# Patient Record
Sex: Male | Born: 1975 | Race: Black or African American | Hispanic: No | Marital: Married | Smoking: Never smoker
Health system: Southern US, Community
[De-identification: ages and names within clinical notes are randomized; demographics above are authoritative.]

## PROBLEM LIST (undated history)

## (undated) DIAGNOSIS — Z86718 Personal history of other venous thrombosis and embolism: Secondary | ICD-10-CM

## (undated) DIAGNOSIS — D759 Disease of blood and blood-forming organs, unspecified: Secondary | ICD-10-CM

## (undated) DIAGNOSIS — T7840XA Allergy, unspecified, initial encounter: Secondary | ICD-10-CM

## (undated) HISTORY — DX: Allergy, unspecified, initial encounter: T78.40XA

## (undated) HISTORY — PX: KNEE SURGERY: SHX244

---

## 2007-07-15 ENCOUNTER — Emergency Department (HOSPITAL_COMMUNITY): Admission: EM | Admit: 2007-07-15 | Discharge: 2007-07-15 | Payer: Self-pay | Admitting: Emergency Medicine

## 2007-07-17 ENCOUNTER — Ambulatory Visit (HOSPITAL_COMMUNITY): Admission: RE | Admit: 2007-07-17 | Discharge: 2007-07-18 | Payer: Self-pay | Admitting: Specialist

## 2007-07-29 ENCOUNTER — Inpatient Hospital Stay (HOSPITAL_COMMUNITY): Admission: RE | Admit: 2007-07-29 | Discharge: 2007-08-04 | Payer: Self-pay | Admitting: Orthopedic Surgery

## 2011-03-01 NOTE — Discharge Summary (Signed)
[name], [name]                ACCOUNT NO.:  [id number]   MEDICAL RECORD NO.:  [id number]          PATIENT TYPE:  INP   LOCATION:  4728                         FACILITY:  [facility]   PHYSICIAN:  Brian Hayden, M.D. DATE OF BIRTH:  [birth year]   DATE OF ADMISSION:  07/29/2007  DATE OF DISCHARGE:  08/04/2007                               DISCHARGE SUMMARY   PRIMARY CARE PHYSICIAN:  Dr. Dorothyann Hayden.   FINAL DIAGNOSES:  1. Bilateral pulmonary emboli.  2. Respiratory distress.  3. Anemia.  4. Severe iron deficiency.  5. Weakness.   PROCEDURES:  1. Chest x-ray, portable, completed on July 29, 2007, August 01, 2007 and August 03, 2007.  2. A CT scan with angiographic contrast completed July 29, 2007.   HISTORY OF PRESENT ILLNESS:  Brian Hayden is a 35 year old gentleman who  recently underwent left patellar tendon repair secondary to rupture on  July 17, 2007.  He indicated that the night prior to this  admission, he had developed shortness of breath.  He did not complain of  any chest pain or cough.  He went to see Dr. August Hayden, his orthopedic  surgeon, who ordered a CAT scan of the patient's chest.  The CAT scan  revealed bilateral pulmonary emboli, therefore, he was admitted into the  hospital .   PAST MEDICAL HISTORY:  Please see that dictated by Dr. Maurice Hayden.   HOSPITAL COURSE:  1. Bilateral pulmonary emboli.  A chest x-ray was done on July 29, 2007.  It revealed bibasilar air space disease, which could be due      to pulmonary infarct or pneumonia.  A CT scan of the patient's      chest with angiographic contrast was completed.  It revealed      bilateral lower lobe pulmonary thromboembolism, bilateral lower      lobe air space disease.  Developing pulmonary infarct was not      excluded.  The patient was started on IV heparin, followed by      Coumadin.  His complaints of chest pain have been minimal over the      course of his hospitalization.  He  has had an occasional episode of      shortness of breath.  His shortness of breath had initially been      primarily with activity, although he did report at least one      episode on the morning of October 17th of shortness of breath.      Oxygen therapy was provided to the patient as well as breathing      treatments.  By the date of discharge his shortness of breath      resolved.  He indicated he was no longer having chest pain, and      requested to be discharged home.  2. Bilateral lower lobe air space disease, seen on CT scan of the      chest.  The patient spiked fevers off and on throughout his      hospitalization.  Moxifloxacin  400 mg p.o. IV was provided.  The      patient's fevers had gone up as high as 103 on October 17.      However, since that time, the patient has been afebrile.  3. Anemia.  The patient indicates that he had been told in the past      that he did suffer from anemia.  Iron studies were ordered on      October 16th.  The patient's iron level came back at 13 with a      percent saturation of 8.  It appears that the patient does have      severe iron deficiency.  He has been started on ferrous sulfate.      The patient's HIV studies were ordered, and the patient was found      to be HIV negative.  4. Weakness.  This may be secondary to the patient's decreased      mobility following his surgical procedure.  The anemia may or may      not have contributed to this, as well as the shortness of breath.   CONDITION AT THE TIME OF DISCHARGE:  Appears to be stable.  Currently,  the patient denies having any shortness of breath or chest pain, and he  requests to be discharged home today.   VITALS:  Temperature 99.9, heart rate 89, respirations 18, blood  pressure 113/68, O2 sat 98% on 2 L.   The patient's PT at the time of discharge was 33.1, INR 3.1.   The decision has been made to discharge the patient from the hospital.  The patient will be discharged home  on:  1. Cyclobenzaprine 5 mg p.o. t.i.d.  2. Mucinex 600 mg p.o. b.i.d.  3. Moxifloxacin 400 mg p.o. q. day.  4. Coumadin 5 mg p.o. q. day.  5. Combivent MDI 2 puffs q.i.d.  6. Ferrous sulfate 325 mg p.o. b.i.d.      Brian Hayden, M.D.  Electronically Signed     OR/MEDQ  D:  08/04/2007  T:  08/04/2007  Job:  [id number]   cc:   Brian Hayden, M.D.  Brian Hayden, M.D.  Brian Hayden, M.D.

## 2011-03-01 NOTE — Op Note (Signed)
Brian Hayden, Brian Hayden                ACCOUNT NO.:  [id number]   MEDICAL RECORD NO.:  [id number]          PATIENT TYPE:  OIB   LOCATION:  5031                         FACILITY:  [facility]   PHYSICIAN:  Kerrin Champagne, M.D.   DATE OF BIRTH:  1976-03-02   DATE OF PROCEDURE:  07/17/2007  DATE OF DISCHARGE:                               OPERATIVE REPORT   PREOPERATIVE DIAGNOSIS:  Left patella tendon rupture.   POSTOPERATIVE DIAGNOSES:  Left patella tendon rupture, middle one-third  of tendon off the inferior pole of the patella, both medial and lateral  one-thirds off the mid substance and distal portions of the patella  tendon.  Small fleck of bone off with the lateral one-third.   PROCEDURE:  Left open patella tendon repair using a #2 FiberWire x2  through drill holes, through the central portions of the patella and at  the bone sutures of the medial and lateral portions of the tendinous  rupture fragments.   SURGEON:  Kerrin Champagne, M.D.   ASSISTANT:  Wende Neighbors, P.A.   ANESTHESIA:  General via orotracheal intubation, Dr. Gelene Mink,  supplemented with local infiltration with Marcaine 0.5% plain.   ESTIMATED BLOOD LOSS:  75 mL or less.   COMPLICATIONS:  None.   TOTAL TOURNIQUET TIME:  70 minutes at 350 mmHg.   BRIEF CLINICAL HISTORY:  The patient is a 35 year old male who felt a  sudden pop in his left knee while playing basketball 2 days ago, with  immediate difficulty with extending the knee and weakness in the left  quadriceps.  He was seen in the emergency room at [facility] on  07/15/2007 and evaluated, and found to have a patella tendon rupture.  Radiographic signs of patella alta acutely.  He was placed in a knee  immobilizer, seen in the office yesterday on 07/16/2007, and scheduled  to undergo a left patella tendon rupture repair clinically, his patella  alta.  No active extension of the knee possible.  Findings consistent  with a complete left patella tendon  rupture.   INTRAOPERATIVE FINDINGS:  Described above.   DESCRIPTION OF PROCEDURE:  After adequate general anesthesia, a bump  under the left buttock, a tourniquet about the left upper thigh, a  standard prep with DuraPrep solution from left ankle to upper thigh,  draped in the usual manner.  The left lower extremity was held and  exsanguinated with Esmarch bandage tourniquet inflated to 350 mmHg.   Incision in the midline extending from just above the anterior tibial  tubercle to the superior pole of the patella, through the skin and  subcutaneous layers directly down to the peritenon.  This was incised  and lined with the skin incision.  Care taken to try and preserve the  infrapatella branch of the saphenous nerve and the peritenon over the  inferior one-half of the patella tendon.  The tendon itself was exposed.  A stump of the tendon appeared to have torn off of the inferior pole  centrally, lowering the central one-third to 40% of the patella tendon.  Two #2 FiberWire sutures were  passed through the stump in a modified  Kessler-type suture.  The stump then retracted distally, and the patella  retracted superiorly, and the joint irrigated with copious amounts of  irrigant solution.  Evaluation of the joint surface demonstrated no  significant abnormalities.   He did have a tear of his synovial lining and it was irregular.  This  was repaired in a single layer with the patient's retinaculum of the  knee into the case.   The area over the inferior pole of the patella was then carefully  exposed, lifting 2 flaps of patella tendon fragments both medial and  lateral, exposing the central portions of the distal portion of the  patella.  A #4 cutting burr was then used to make [place] a trough in the  osteochondral border of the patella inferiorly, medial to lateral.  Drill holes were then placed using approximately three 64-inch drill  bit, central drill hole, then one medial and one  lateral.  These were  marked using cautery.  The patella tendon was then retracted distally,  and the #2 FiberWire was then passed through the central hole, 1 from  each of the medial and lateral slits.  Then the lateral medial  FiberWires were passed through their individual holes and delivered to  the inferior pole of the patella.  The knee flexed at 45 degrees.  The  patella was then reduced to the stump centrally and the FiberWires then  tied over the superior pole of the patella.  This nicely approximated  the stump to the inferior pole of the patella against raw cancellous  bone surface.  The knee was examined and evaluated.  The knee flexed at  45 degrees.  The inferior pole of the patella appeared to be in line  with the superior portion of the intercondylar notch, and normal  appearance.  It did not appear to demonstrate any significant baja.   Next, the medial and lateral flaps were then carefully rotated downwards  and sutured to the central stump and distal portions of tendon, using  the interrupted 0-Ethibond sutures in simple horizontal mattress  sutures.   The retinaculum of the knee was torn transversely both medial and  lateral, traversing the lateral portion of the retinaculum, was  reapproximated with interrupted 0-Ethibond sutures.  The medial  retinaculum was also reapproximated with interrupted 0-Ethibond sutures.  The knee then brought into full extension, the peritenon was then  reapproximated over the ruptured patella tendon repair site, providing a  good peritenon coverage distally.  Subcutaneous areas were then  approximated with interrupted 0-Vicryl sutures and 2-0 Vicryl sutures,  and the skin closed with stainless steel staples.  Adaptic 4 x 4, ABD  pads affixed to the skin with Kerlix, and then Ace wrap was applied.   The patient was then placed into the knee immobilizer at full extension.  The tourniquet was released.  Total tourniquet time was 70  minutes at  350 mmHg.   The patient was then reactivated, extubated, and returned to the  recovery room in satisfactory condition.      Kerrin Champagne, M.D.  Electronically Signed     JEN/MEDQ  D:  07/17/2007  T:  07/17/2007  Job:  [id number]

## 2011-03-01 NOTE — H&P (Signed)
Brian Hayden, Brian Hayden                ACCOUNT NO.:  [id number]   MEDICAL RECORD NO.:  [id number]          PATIENT TYPE:  INP   LOCATION:  4703                         FACILITY:  [facility]   PHYSICIAN:  Madaline Savage, MD        DATE OF BIRTH:  [birth year]   DATE OF ADMISSION:  07/29/2007  DATE OF DISCHARGE:                              HISTORY & PHYSICAL   PRIMARY CARE PHYSICIAN:  Robyn N. Allyne Gee, M.D., [facility].   ORTHOPEDIC SURGEON:  Kerrin Champagne, M.D.   CHIEF COMPLAINT:  Shortness of breath.   HISTORY OF PRESENT ILLNESS:  Mr. Centrella is a 35 year old gentleman who  had a left patellar tendon rupture repair done on July 17, 2007.  He comes in with shortness of breath since last night.  He was  apparently playing basketball and sustained an injury on his left  patellar tendon which was repaired by Dr. Otelia Sergeant on July 17, 2007.  After discharge home, he had back spasm and had seen Dr. August Saucer as an  outpatient.  He developed sudden shortness of breath last night but did  not have any chest pain or any cough.  He denies having any fever or  chills.  He saw Dr. August Saucer today in his office who sent him for a CT of  his chest which apparently showed a bilateral pulmonary embolism.  He  now denies any shortness of breath or any chest pain or any dysuria.   PAST MEDICAL HISTORY:  None.   PAST SURGICAL HISTORY:  1. He had left foot surgery in the past.  2. Left patellar tendon repair on July 17, 2007.   ALLERGIES:  NO KNOWN DRUG ALLERGIES.   CURRENT MEDICATIONS:  He is on Vicodin up to 4 tablets daily and  Flexeril.   SOCIAL HISTORY:  He is married.  He does not have any kids.  He denies  any history of smoking.  He drinks alcohol occasionally.  No history of  any drug abuse.   FAMILY HISTORY:  Both of his parents are in their 35s.  His mother has  hypertension.   REVIEW OF SYSTEMS:  GENERAL:  He denies any recent weight loss, weight  gain.  No fevers or chills.   HEENT:  No headaches, no blurred vision, no  sore throat.  CARDIOVASCULAR:  No chest pain.  RESPIRATORY:  Denies  shortness of breath now.  No cough.  ABDOMEN:  No abdominal pain,  nausea, vomiting, diarrhea, or constipation.   PHYSICAL EXAMINATION:  GENERAL:  He is alert and oriented x3.  VITAL SIGNS:  Temperature 101.4, heart rate 105 per minute, respiratory  rate 20 per minute, blood pressure 129/73, oxygen saturation 97% on room  air.  HEENT:  Normocephalic, atraumatic. .  Mucous membranes moist.  NECK:  Supple.  No JVD.  No carotid bruit.  CARDIOVASCULAR:  S1 and S2, regular rate and rhythm.  CHEST:  Clear to auscultation.  ABDOMEN:  Soft.  Bowel sounds heard.  EXTREMITIES:  His left knee is wrapped.  There is no edema in both lower  extremities.   LABORATORY DATA:  His CT of the chest results apparently showed  bilateral pulmonary embolism.  The CT result is not yet available in the  computer.   IMPRESSION:  1. Acute pulmonary embolism.  2. Fever.   PLAN:  1. This is a 35 year old gentleman who sustained an acute pulmonary      embolism after he was immobilized for 11 days following a surgery.      At this time, he does not appear to be in any hemodynamic      compromise.  His blood pressure is stable.  He is tachycardic at      this time, and his oxygen saturations are 97% on room air.  We will      admit him to a monitored floor.  We will monitor his oxygen      saturations and his blood pressure and heart rate, and we will      start him on heparin by weight and Coumadin.  The plan will be to      treat him with 6 months of Coumadin, as this is his first episode      of venous clot.  2. Fever.  He does have a fever of 101.4.  This could be secondary to      the pulmonary embolism, but we will need to rule out an infection.      I will get an x-ray of the chest, urinalysis, and will also get      blood cultures on him.  3. Recent surgery.  He had left patellar repair  surgery 10 days ago.      He still complains of pain in his leg.  I will put him on Vicodin      as needed for pain.      Madaline Savage, MD  Electronically Signed     PKN/MEDQ  D:  07/29/2007  T:  07/29/2007  Job:  [id number]

## 2011-07-27 LAB — BASIC METABOLIC PANEL
BUN: 13
BUN: 13
CO2: 30
CO2: 30
Calcium: 8.5
Chloride: 98
Creatinine, Ser: 1.01
Creatinine, Ser: 1.04
Creatinine, Ser: 1.14
GFR calc Af Amer: >60
GFR calc Af Amer: >60
GFR calc non Af Amer: >60
GFR calc non Af Amer: >60
GFR calc non Af Amer: >60
Potassium: 4.4
Sodium: 135
Sodium: 137

## 2011-07-27 LAB — CBC
HCT: 29.9 — ABNORMAL LOW
Hemoglobin: 9.8 — ABNORMAL LOW
Hemoglobin: 9.8 — ABNORMAL LOW
MCHC: 31.9
MCV: 67 — ABNORMAL LOW
MCV: 67.2 — ABNORMAL LOW
Platelets: 339
Platelets: 366
RBC: 4.33
RBC: 4.49
RDW: 15.2 — ABNORMAL HIGH
RDW: 15.5 — ABNORMAL HIGH
WBC: 11.5 — ABNORMAL HIGH
WBC: 13.3 — ABNORMAL HIGH

## 2011-07-27 LAB — IRON AND TIBC
TIBC: 164 — ABNORMAL LOW
UIBC: 151

## 2011-07-27 LAB — URINALYSIS, ROUTINE W REFLEX MICROSCOPIC
Glucose, UA: NEGATIVE
Hgb urine dipstick: NEGATIVE
Specific Gravity, Urine: 1.015
Urobilinogen, UA: 1

## 2011-07-27 LAB — CULTURE, BLOOD (ROUTINE X 2)

## 2011-07-27 LAB — DIFFERENTIAL
Basophils Absolute: 0
Basophils Relative: 0
Eosinophils Absolute: 0
Lymphs Abs: 0.7
Monocytes Relative: 8
Neutro Abs: 11.5 — ABNORMAL HIGH

## 2011-07-27 LAB — HEPARIN LEVEL (UNFRACTIONATED)
Heparin Unfractionated: 0.31
Heparin Unfractionated: 0.62

## 2011-07-27 LAB — PROTIME-INR
INR: 2.8 — ABNORMAL HIGH
INR: 3.1 — ABNORMAL HIGH
Prothrombin Time: 21.6 — ABNORMAL HIGH
Prothrombin Time: 30.5 — ABNORMAL HIGH

## 2011-07-27 LAB — VITAMIN B12: Vitamin B-12: 253 (ref 211–911)

## 2011-07-27 LAB — SEDIMENTATION RATE: Sed Rate: 56 — ABNORMAL HIGH

## 2011-07-28 LAB — HEPARIN LEVEL (UNFRACTIONATED)
Heparin Unfractionated: 0.34
Heparin Unfractionated: 0.4
Heparin Unfractionated: 0.4

## 2011-07-28 LAB — URINE CULTURE
Colony Count: NO GROWTH
Culture: NO GROWTH

## 2011-07-28 LAB — CBC
Hemoglobin: 11 — ABNORMAL LOW
Hemoglobin: 9.3 — ABNORMAL LOW
MCHC: 32.1
MCHC: 32.5
MCHC: 32.8
MCV: 68.8 — ABNORMAL LOW
Platelets: 192
Platelets: 237
Platelets: 131 — ABNORMAL LOW
RDW: 14.4 — ABNORMAL HIGH
RDW: 14.8 — ABNORMAL HIGH
RDW: 14.9 — ABNORMAL HIGH
RDW: 14.2 — ABNORMAL HIGH
WBC: 4.4

## 2011-07-28 LAB — PROTIME-INR
INR: 1.5
Prothrombin Time: 15.6 — ABNORMAL HIGH
Prothrombin Time: 16 — ABNORMAL HIGH
Prothrombin Time: 18 — ABNORMAL HIGH

## 2011-07-28 LAB — BASIC METABOLIC PANEL
BUN: 8
BUN: 13
CO2: 29
Calcium: 9
Chloride: 94 — ABNORMAL LOW
Creatinine, Ser: 0.92
Creatinine, Ser: 1.38
GFR calc non Af Amer: 60 — ABNORMAL LOW

## 2011-07-28 LAB — CULTURE, BLOOD (ROUTINE X 2): Culture: NO GROWTH

## 2011-07-28 LAB — HOMOCYSTEINE: Homocysteine: 7.7

## 2011-07-28 LAB — TYPE AND SCREEN: ABO/RH(D): B POS

## 2011-07-28 LAB — URINALYSIS, ROUTINE W REFLEX MICROSCOPIC
Bilirubin Urine: NEGATIVE
Hgb urine dipstick: NEGATIVE
Specific Gravity, Urine: 1.014
Urobilinogen, UA: 1

## 2011-07-28 LAB — ABO/RH: ABO/RH(D): B POS

## 2015-06-22 ENCOUNTER — Emergency Department (HOSPITAL_COMMUNITY)
Admission: EM | Admit: 2015-06-22 | Discharge: 2015-06-22 | Disposition: A | Discharge status: Home / Self Care | Payer: Commercial Managed Care - HMO | Attending: Emergency Medicine | Admitting: Emergency Medicine

## 2015-06-22 ENCOUNTER — Encounter (HOSPITAL_COMMUNITY): Payer: Self-pay

## 2015-06-22 ENCOUNTER — Emergency Department (HOSPITAL_COMMUNITY): Payer: Commercial Managed Care - HMO

## 2015-06-22 DIAGNOSIS — S6992XA Unspecified injury of left wrist, hand and finger(s), initial encounter: Secondary | ICD-10-CM | POA: Diagnosis present

## 2015-06-22 DIAGNOSIS — S62395A Other fracture of fourth metacarpal bone, left hand, initial encounter for closed fracture: Secondary | ICD-10-CM | POA: Insufficient documentation

## 2015-06-22 DIAGNOSIS — Y9389 Activity, other specified: Secondary | ICD-10-CM | POA: Diagnosis not present

## 2015-06-22 DIAGNOSIS — Y92410 Unspecified street and highway as the place of occurrence of the external cause: Secondary | ICD-10-CM | POA: Insufficient documentation

## 2015-06-22 DIAGNOSIS — S62393A Other fracture of third metacarpal bone, left hand, initial encounter for closed fracture: Secondary | ICD-10-CM | POA: Insufficient documentation

## 2015-06-22 DIAGNOSIS — S62308A Unspecified fracture of other metacarpal bone, initial encounter for closed fracture: Secondary | ICD-10-CM

## 2015-06-22 DIAGNOSIS — Y998 Other external cause status: Secondary | ICD-10-CM | POA: Diagnosis not present

## 2015-06-22 MED ORDER — OXYCODONE-ACETAMINOPHEN 5-325 MG PO TABS
1.0000 | ORAL_TABLET | Freq: Once | ORAL | Status: AC
  Administered 2015-06-22: 1 via ORAL
  Filled 2015-06-22: qty 1

## 2015-06-22 MED ORDER — OXYCODONE-ACETAMINOPHEN 5-325 MG PO TABS: 1.0000 | ORAL_TABLET | ORAL | Status: DC | PRN

## 2015-06-22 NOTE — ED Notes (Signed)
Patient transported to X-ray 

## 2015-06-22 NOTE — Progress Notes (Signed)
Orthopedic Tech Progress Note Patient Details:  Brian Hayden 31-Dec-1975 [id number]  Ortho Devices Type of Ortho Device: Ace wrap, Volar splint, Ulna gutter splint Ortho Device/Splint Interventions: Application   Cammer, Mickie Bail 06/22/2015, 6:21 PM
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
## 2015-06-22 NOTE — Discharge Instructions (Signed)
Hand Fracture, Metacarpals °Fractures of metacarpals are breaks in the bones of the hand. They extend from the knuckles to the wrist. These bones can undergo many types of fractures. There are different ways of treating these fractures, all of which may be correct. °TREATMENT  °Hand fractures can be treated with:  °· Non-reduction - The fracture is casted without changing the positions of the fracture (bone pieces) involved. This fracture is usually left in a cast for 4 to 6 weeks or as your caregiver thinks necessary. °· Closed reduction - The bones are moved back into position without surgery and then casted. °· ORIF (open reduction and internal fixation) - The fracture site is opened and the bone pieces are fixed into place with some type of hardware, such as screws, etc. They are then casted. °Your caregiver will discuss the type of fracture you have and the treatment that should be best for that problem. If surgery is chosen, let your caregivers know about the following.  °LET YOUR CAREGIVERS KNOW ABOUT: °· Allergies. °· Medications you are taking, including herbs, eye drops, over the counter medications, and creams. °· Use of steroids (by mouth or creams). °· Previous problems with anesthetics or novocaine. °· Possibility of pregnancy. °· History of blood clots (thrombophlebitis). °· History of bleeding or blood problems. °· Previous surgeries. °· Other health problems. °AFTER THE PROCEDURE °After surgery, you will be taken to the recovery area where a nurse will watch and check your progress. Once you are awake, stable, and taking fluids well, barring other problems, you'll be allowed to go home. Once home, an ice pack applied to your operative site may help with pain and keep the swelling down. °HOME CARE INSTRUCTIONS  °· Follow your caregiver's instructions as to activities, exercises, physical therapy, and driving a car. °· Daily exercise is helpful for keeping range of motion and strength. Exercise as  instructed. °· To lessen swelling, keep the injured hand elevated above the level of your heart as much as possible. °· Apply ice to the injury for 15-20 minutes each hour while awake for the first 2 days. Put the ice in a plastic bag and place a thin towel between the bag of ice and your cast. °· Move the fingers of your casted hand several times a day. °· If a plaster or fiberglass cast was applied: °¨ Do not try to scratch the skin under the cast using a sharp or pointed object. °¨ Check the skin around the cast every day. You may put lotion on red or sore areas. °¨ Keep your cast dry. Your cast can be protected during bathing with a plastic bag. Do not put your cast into the water. °· If a plaster splint was applied: °¨ Wear your splint for as long as directed by your caregiver or until seen again. °¨ Do not get your splint wet. Protect it during bathing with a plastic bag. °¨ You may loosen the elastic bandage around the splint if your fingers start to get numb, tingle, get cold or turn blue. °· Do not put pressure on your cast or splint; this may cause it to break. Especially, do not lean plaster casts on hard surfaces for 24 hours after application. °· Take medications as directed by your caregiver. °· Only take over-the-counter or prescription medicines for pain, discomfort, or fever as directed by your caregiver. °· Follow-up as provided by your caregiver. This is very important in order to avoid permanent injury or disability and chronic   pain. °SEEK MEDICAL CARE IF:  °· Increased bleeding (more than a small spot) from beneath your cast or splint if there is beneath the cast as with an open reduction. °· Redness, swelling, or increasing pain in the wound or from beneath your cast or splint. °· Pus coming from wound or from beneath your cast or splint. °· An unexplained oral temperature above 102° F (38.9° C) develops, or as your caregiver suggests. °· A foul smell coming from the wound or dressing or from  beneath your cast or splint. °· You have a problem moving any of your fingers. °SEEK IMMEDIATE MEDICAL CARE IF:  °· You develop a rash °· You have difficulty breathing °· You have any allergy problems °If you do not have a window in your cast for observing the wound, a discharge or minor bleeding may show up as a stain on the outside of your cast. Report these findings to your caregiver. °MAKE SURE YOU:  °· Understand these instructions. °· Will watch your condition. °· Will get help right away if you are not doing well or get worse. °Document Released: 10/03/2005 Document Revised: 12/26/2011 Document Reviewed: 05/22/2008 °Exit[employer]® Patient Information ©2015 [employer]. This information is not intended to replace advice given to you by your health care provider. Make sure you discuss any questions you have with your health care provider. ° °

## 2015-06-22 NOTE — ED Notes (Signed)
Ortho tech at bedside 

## 2015-06-22 NOTE — ED Notes (Signed)
Pt. returned from XR. 

## 2015-06-23 ENCOUNTER — Other Ambulatory Visit: Payer: Self-pay | Admitting: Orthopedic Surgery

## 2015-06-24 ENCOUNTER — Encounter (HOSPITAL_BASED_OUTPATIENT_CLINIC_OR_DEPARTMENT_OTHER): Payer: Self-pay

## 2015-06-24 ENCOUNTER — Ambulatory Visit (HOSPITAL_BASED_OUTPATIENT_CLINIC_OR_DEPARTMENT_OTHER): Payer: No Typology Code available for payment source | Admitting: Anesthesiology

## 2015-06-24 ENCOUNTER — Ambulatory Visit (HOSPITAL_BASED_OUTPATIENT_CLINIC_OR_DEPARTMENT_OTHER)
Admission: RE | Admit: 2015-06-24 | Discharge: 2015-06-24 | Disposition: A | Discharge status: Home / Self Care | Payer: No Typology Code available for payment source | Source: Ambulatory Visit | Attending: Orthopedic Surgery | Admitting: Orthopedic Surgery

## 2015-06-24 ENCOUNTER — Encounter (HOSPITAL_BASED_OUTPATIENT_CLINIC_OR_DEPARTMENT_OTHER)
Admission: RE | Disposition: A | Discharge status: Home / Self Care | Payer: Self-pay | Source: Ambulatory Visit | Attending: Orthopedic Surgery

## 2015-06-24 DIAGNOSIS — Z91030 Bee allergy status: Secondary | ICD-10-CM | POA: Diagnosis not present

## 2015-06-24 DIAGNOSIS — Z9889 Other specified postprocedural states: Secondary | ICD-10-CM | POA: Insufficient documentation

## 2015-06-24 DIAGNOSIS — S62303A Unspecified fracture of third metacarpal bone, left hand, initial encounter for closed fracture: Secondary | ICD-10-CM | POA: Insufficient documentation

## 2015-06-24 DIAGNOSIS — S62305A Unspecified fracture of fourth metacarpal bone, left hand, initial encounter for closed fracture: Secondary | ICD-10-CM | POA: Diagnosis not present

## 2015-06-24 HISTORY — PX: OPEN REDUCTION INTERNAL FIXATION (ORIF) METACARPAL: SHX6234

## 2015-06-24 HISTORY — DX: Disease of blood and blood-forming organs, unspecified: D75.9

## 2015-06-24 HISTORY — DX: Personal history of other venous thrombosis and embolism: Z86.718

## 2015-06-24 SURGERY — OPEN REDUCTION INTERNAL FIXATION (ORIF) METACARPAL
Anesthesia: General | Site: Hand | Laterality: Left

## 2015-06-24 MED ORDER — BUPIVACAINE-EPINEPHRINE (PF) 0.5% -1:200000 IJ SOLN
INTRAMUSCULAR | Status: DC | PRN
  Administered 2015-06-24: 30 mL via PERINEURAL

## 2015-06-24 MED ORDER — HYDROMORPHONE HCL 1 MG/ML IJ SOLN: 0.2500 mg | INTRAMUSCULAR | Status: DC | PRN

## 2015-06-24 MED ORDER — FENTANYL CITRATE (PF) 100 MCG/2ML IJ SOLN
50.0000 ug | INTRAMUSCULAR | Status: DC | PRN
  Administered 2015-06-24: 100 ug via INTRAVENOUS

## 2015-06-24 MED ORDER — ONDANSETRON HCL 4 MG/2ML IJ SOLN
INTRAMUSCULAR | Status: AC
  Filled 2015-06-24: qty 2

## 2015-06-24 MED ORDER — PROMETHAZINE HCL 25 MG/ML IJ SOLN: 6.2500 mg | INTRAMUSCULAR | Status: DC | PRN

## 2015-06-24 MED ORDER — LACTATED RINGERS IV SOLN
INTRAVENOUS | Status: DC
  Administered 2015-06-24: 11:00:00 via INTRAVENOUS

## 2015-06-24 MED ORDER — ONDANSETRON HCL 4 MG/2ML IJ SOLN
INTRAMUSCULAR | Status: DC | PRN
  Administered 2015-06-24: 4 mg via INTRAVENOUS

## 2015-06-24 MED ORDER — OXYCODONE-ACETAMINOPHEN 5-325 MG PO TABS: 1.0000 | ORAL_TABLET | ORAL | Status: DC | PRN

## 2015-06-24 MED ORDER — CHLORHEXIDINE GLUCONATE 4 % EX LIQD: 60.0000 mL | Freq: Once | CUTANEOUS | Status: DC

## 2015-06-24 MED ORDER — GLYCOPYRROLATE 0.2 MG/ML IJ SOLN: 0.2000 mg | Freq: Once | INTRAMUSCULAR | Status: DC | PRN

## 2015-06-24 MED ORDER — CEFAZOLIN SODIUM-DEXTROSE 2-3 GM-% IV SOLR
INTRAVENOUS | Status: AC
  Filled 2015-06-24: qty 50

## 2015-06-24 MED ORDER — SUCCINYLCHOLINE CHLORIDE 20 MG/ML IJ SOLN
INTRAMUSCULAR | Status: AC
  Filled 2015-06-24: qty 1

## 2015-06-24 MED ORDER — MIDAZOLAM HCL 2 MG/2ML IJ SOLN
INTRAMUSCULAR | Status: AC
  Filled 2015-06-24: qty 2

## 2015-06-24 MED ORDER — SCOPOLAMINE 1 MG/3DAYS TD PT72: 1.0000 | MEDICATED_PATCH | Freq: Once | TRANSDERMAL | Status: DC | PRN

## 2015-06-24 MED ORDER — FENTANYL CITRATE (PF) 100 MCG/2ML IJ SOLN
INTRAMUSCULAR | Status: AC
  Filled 2015-06-24: qty 2

## 2015-06-24 MED ORDER — DEXAMETHASONE SODIUM PHOSPHATE 10 MG/ML IJ SOLN
INTRAMUSCULAR | Status: DC | PRN
  Administered 2015-06-24: 10 mg via INTRAVENOUS

## 2015-06-24 MED ORDER — PROPOFOL INFUSION 10 MG/ML OPTIME
INTRAVENOUS | Status: DC | PRN
  Administered 2015-06-24: 100 ug/kg/min via INTRAVENOUS

## 2015-06-24 MED ORDER — MEPERIDINE HCL 25 MG/ML IJ SOLN: 6.2500 mg | INTRAMUSCULAR | Status: DC | PRN

## 2015-06-24 MED ORDER — FENTANYL CITRATE (PF) 100 MCG/2ML IJ SOLN
INTRAMUSCULAR | Status: AC
  Filled 2015-06-24: qty 4

## 2015-06-24 MED ORDER — PROPOFOL 10 MG/ML IV BOLUS
INTRAVENOUS | Status: AC
  Filled 2015-06-24: qty 20

## 2015-06-24 MED ORDER — PROPOFOL 10 MG/ML IV BOLUS
INTRAVENOUS | Status: DC | PRN
  Administered 2015-06-24: 40 mg via INTRAVENOUS

## 2015-06-24 MED ORDER — MIDAZOLAM HCL 2 MG/2ML IJ SOLN
1.0000 mg | INTRAMUSCULAR | Status: DC | PRN
  Administered 2015-06-24: 2 mg via INTRAVENOUS

## 2015-06-24 MED ORDER — DEXAMETHASONE SODIUM PHOSPHATE 10 MG/ML IJ SOLN
INTRAMUSCULAR | Status: AC
Start: 2015-06-24 — End: 2015-06-24
  Filled 2015-06-24: qty 1

## 2015-06-24 MED ORDER — CEFAZOLIN SODIUM-DEXTROSE 2-3 GM-% IV SOLR
2.0000 g | INTRAVENOUS | Status: AC
  Administered 2015-06-24: 2 g via INTRAVENOUS

## 2015-06-24 MED ORDER — LACTATED RINGERS IV SOLN: 500.0000 mL | INTRAVENOUS | Status: DC

## 2015-06-24 SURGICAL SUPPLY — 79 items
APL SKNCLS STERI-STRIP NONHPOA (GAUZE/BANDAGES/DRESSINGS) ×1
BANDAGE ELASTIC 3 VELCRO ST LF (GAUZE/BANDAGES/DRESSINGS) ×3 IMPLANT
BANDAGE ELASTIC 4 VELCRO ST LF (GAUZE/BANDAGES/DRESSINGS) ×3 IMPLANT
BENZOIN TINCTURE PRP APPL 2/3 (GAUZE/BANDAGES/DRESSINGS) ×3 IMPLANT
BIT DRILL 1.1 MINI (BIT) ×1 IMPLANT
BLADE SURG 15 STRL LF DISP TIS (BLADE) ×1 IMPLANT
BLADE SURG 15 STRL SS (BLADE) ×3
BNDG CMPR 9X4 STRL LF SNTH (GAUZE/BANDAGES/DRESSINGS) ×1
BNDG CMPR MD 5X2 ELC HKLP STRL (GAUZE/BANDAGES/DRESSINGS) ×1
BNDG ELASTIC 2 VLCR STRL LF (GAUZE/BANDAGES/DRESSINGS) ×3 IMPLANT
BNDG ESMARK 4X9 LF (GAUZE/BANDAGES/DRESSINGS) ×3 IMPLANT
BNDG GAUZE ELAST 4 BULKY (GAUZE/BANDAGES/DRESSINGS) ×3 IMPLANT
CANISTER SUCT 1200ML W/VALVE (MISCELLANEOUS) IMPLANT
CLOSURE WOUND 1/2 X4 (GAUZE/BANDAGES/DRESSINGS) ×1
CORDS BIPOLAR (ELECTRODE) ×3 IMPLANT
COVER BACK TABLE 60X90IN (DRAPES) ×3 IMPLANT
CUFF TOURNIQUET SINGLE 18IN (TOURNIQUET CUFF) IMPLANT
CUFF TOURNIQUET SINGLE 24IN (TOURNIQUET CUFF) ×3 IMPLANT
DECANTER SPIKE VIAL GLASS SM (MISCELLANEOUS) IMPLANT
DRAPE EXTREMITY T 121X128X90 (DRAPE) ×3 IMPLANT
DRAPE OEC MINIVIEW 54X84 (DRAPES) ×3 IMPLANT
DRAPE SURG 17X23 STRL (DRAPES) ×3 IMPLANT
DRILL BIT 1.1 MINI (BIT) ×3
DURAPREP 26ML APPLICATOR (WOUND CARE) ×3 IMPLANT
GAUZE SPONGE 4X4 12PLY STRL (GAUZE/BANDAGES/DRESSINGS) ×3 IMPLANT
GAUZE SPONGE 4X4 16PLY XRAY LF (GAUZE/BANDAGES/DRESSINGS) IMPLANT
GAUZE XEROFORM 1X8 LF (GAUZE/BANDAGES/DRESSINGS) IMPLANT
GLOVE BIOGEL M STRL SZ7.5 (GLOVE) ×3 IMPLANT
GLOVE BIOGEL PI IND STRL 7.0 (GLOVE) ×1 IMPLANT
GLOVE BIOGEL PI INDICATOR 7.0 (GLOVE) ×2
GLOVE ECLIPSE 6.5 STRL STRAW (GLOVE) ×3 IMPLANT
GLOVE SURG SS PI 7.0 STRL IVOR (GLOVE) ×3 IMPLANT
GLOVE SURG SYN 8.0 (GLOVE) ×6 IMPLANT
GOWN STRL REUS W/ TWL LRG LVL3 (GOWN DISPOSABLE) ×2 IMPLANT
GOWN STRL REUS W/TWL LRG LVL3 (GOWN DISPOSABLE) ×6
GOWN STRL REUS W/TWL XL LVL3 (GOWN DISPOSABLE) ×6 IMPLANT
NEEDLE HYPO 25X1 1.5 SAFETY (NEEDLE) IMPLANT
NS IRRIG 1000ML POUR BTL (IV SOLUTION) ×3 IMPLANT
PACK BASIN DAY SURGERY FS (CUSTOM PROCEDURE TRAY) ×3 IMPLANT
PAD CAST 3X4 CTTN HI CHSV (CAST SUPPLIES) ×1 IMPLANT
PAD CAST 4YDX4 CTTN HI CHSV (CAST SUPPLIES) IMPLANT
PADDING CAST ABS 4INX4YD NS (CAST SUPPLIES) ×2
PADDING CAST ABS COTTON 4X4 ST (CAST SUPPLIES) ×1 IMPLANT
PADDING CAST COTTON 3X4 STRL (CAST SUPPLIES) ×3
PADDING CAST COTTON 4X4 STRL (CAST SUPPLIES)
PADDING UNDERCAST 2 STRL (CAST SUPPLIES) ×2
PADDING UNDERCAST 2X4 STRL (CAST SUPPLIES) ×1 IMPLANT
PLATE STRAIGHT 1.5 12 HOLE (Plate) ×3 IMPLANT
PLATE Y 1.5 3H HD/8H SFT (Plate) ×1 IMPLANT
PLATE-7 1.5 3H HD/8H SFT (Plate) ×3 IMPLANT
SCREW CORTEX 1.5X10 (Screw) ×15 IMPLANT
SCREW CORTEX 1.5X11 (Screw) ×9 IMPLANT
SCREW CORTEX 1.5X12 (Screw) ×9 IMPLANT
SCREW CORTEX 1.5X14 (Screw) ×9 IMPLANT
SHEET MEDIUM DRAPE 40X70 STRL (DRAPES) ×3 IMPLANT
SLEEVE SCD COMPRESS KNEE MED (MISCELLANEOUS) ×3 IMPLANT
SLING ARM FOAM STRAP XLG (SOFTGOODS) ×3 IMPLANT
SPLINT PLASTER CAST XFAST 4X15 (CAST SUPPLIES) ×16 IMPLANT
SPLINT PLASTER XTRA FAST SET 4 (CAST SUPPLIES) ×32
STOCKINETTE 4X48 STRL (DRAPES) ×3 IMPLANT
STRIP CLOSURE SKIN 1/2X4 (GAUZE/BANDAGES/DRESSINGS) ×2 IMPLANT
SUCTION FRAZIER TIP 10 FR DISP (SUCTIONS) IMPLANT
SUT ETHILON 4 0 PS 2 18 (SUTURE) IMPLANT
SUT ETHILON 5 0 PS 2 18 (SUTURE) IMPLANT
SUT MERSILENE 4 0 P 3 (SUTURE) IMPLANT
SUT PROLENE 4 0 PS 2 18 (SUTURE) ×3 IMPLANT
SUT VIC AB 2-0 SH 27 (SUTURE) ×6
SUT VIC AB 2-0 SH 27XBRD (SUTURE) ×2 IMPLANT
SUT VIC AB 4-0 P-3 18XBRD (SUTURE) IMPLANT
SUT VIC AB 4-0 P3 18 (SUTURE)
SUT VICRYL 4-0 PS2 18IN ABS (SUTURE) ×3 IMPLANT
SUT VICRYL RAPIDE 4-0 (SUTURE) IMPLANT
SUT VICRYL RAPIDE 4/0 PS 2 (SUTURE) IMPLANT
SYR BULB 3OZ (MISCELLANEOUS) ×3 IMPLANT
SYRINGE 10CC LL (SYRINGE) IMPLANT
TOWEL OR 17X24 6PK STRL BLUE (TOWEL DISPOSABLE) ×6 IMPLANT
TUBE CONNECTING 20'X1/4 (TUBING)
TUBE CONNECTING 20X1/4 (TUBING) IMPLANT
UNDERPAD 30X30 (UNDERPADS AND DIAPERS) ×3 IMPLANT

## 2015-06-24 NOTE — Anesthesia Postprocedure Evaluation (Deleted)
Anesthesia Post Note  Patient: Brian Hayden  Procedure(s) Performed: Procedure(s) (LRB): OPEN REDUCTION INTERNAL FIXATION (ORIF)LEFT LONG AND LEFT RING FINGER METACARPALS (Left)  Anesthesia type: MAC + ax block  Patient location: PACU  Post pain: Pain level controlled  Post assessment: Post-op Vital signs reviewed  Last Vitals: BP 113/94 mmHg  Pulse 69  Temp(Src) 36.6 C (Oral)  Resp 14  Ht 5' 11" (1.803 m)  Wt 222 lb 8 oz (100.925 kg)  BMI 31.05 kg/m2  SpO2 98%  Post vital signs: Reviewed  Level of consciousness: awake  Complications: No apparent anesthesia complications  

## 2015-06-24 NOTE — Transfer of Care (Signed)
Immediate Anesthesia Transfer of Care Note  Patient: Brian Hayden  Procedure(s) Performed: Procedure(s) with comments: OPEN REDUCTION INTERNAL FIXATION (ORIF)LEFT LONG AND LEFT RING FINGER METACARPALS (Left) - ANESTHESIA:  GENERAL/BLOCK  Patient Location: PACU  Anesthesia Type:MAC and Regional  Level of Consciousness: sedated  Airway & Oxygen Therapy: Patient Spontanous Breathing  Post-op Assessment: Report given to RN  Post vital signs: stable  Last Vitals:  Filed Vitals:   06/24/15 1410  BP: 113/94  Pulse: 69  Temp: 36.6 C  Resp: 14    Complications: No apparent anesthesia complications

## 2015-06-24 NOTE — Discharge Instructions (Signed)
° ° °  Regional Anesthesia Blocks ° °1. Numbness or the inability to move the "blocked" extremity may last from 3-48 hours after placement. The length of time depends on the medication injected and your individual response to the medication. If the numbness is not going away after 48 hours, call your surgeon. ° °2. The extremity that is blocked will need to be protected until the numbness is gone and the  Strength has returned. Because you cannot feel it, you will need to take extra care to avoid injury. Because it may be weak, you may have difficulty moving it or using it. You may not know what position it is in without looking at it while the block is in effect. ° °3. For blocks in the legs and feet, returning to weight bearing and walking needs to be done carefully. You will need to wait until the numbness is entirely gone and the strength has returned. You should be able to move your leg and foot normally before you try and bear weight or walk. You will need someone to be with you when you first try to ensure you do not fall and possibly risk injury. ° °4. Bruising and tenderness at the needle site are common side effects and will resolve in a few days. ° °5. Persistent numbness or new problems with movement should be communicated to the surgeon or the [facility] ([phone])/ [facility] ([phone]). ° ° ° °Post Anesthesia Home Care Instructions ° °Activity: °Get plenty of rest for the remainder of the day. A responsible adult should stay with you for 24 hours following the procedure.  °For the next 24 hours, DO NOT: °-Drive a car °-Operate machinery °-Drink alcoholic beverages °-Take any medication unless instructed by your physician °-Make any legal decisions or sign important papers. ° °Meals: °Start with liquid foods such as gelatin or soup. Progress to regular foods as tolerated. Avoid greasy, spicy, heavy foods. If nausea and/or vomiting occur, drink only clear liquids until  the nausea and/or vomiting subsides. Call your physician if vomiting continues. ° °Special Instructions/Symptoms: °Your throat may feel dry or sore from the anesthesia or the breathing tube placed in your throat during surgery. If this causes discomfort, gargle with warm salt water. The discomfort should disappear within 24 hours. ° °If you had a scopolamine patch placed behind your ear for the management of post- operative nausea and/or vomiting: ° °1. The medication in the patch is effective for 72 hours, after which it should be removed.  Wrap patch in a tissue and discard in the trash. Wash hands thoroughly with soap and water. °2. You may remove the patch earlier than 72 hours if you experience unpleasant side effects which may include dry mouth, dizziness or visual disturbances. °3. Avoid touching the patch. Wash your hands with soap and water after contact with the patch. °  ° °

## 2015-06-24 NOTE — Anesthesia Preprocedure Evaluation (Signed)
Anesthesia Evaluation  Patient identified by MRN, date of birth, ID band Patient awake    Reviewed: Allergy & Precautions, NPO status , Patient's Chart, lab work & pertinent test results  Airway Mallampati: II  TM Distance: >3 FB Neck ROM: Full    Dental no notable dental hx.    Pulmonary neg pulmonary ROS,    Pulmonary exam normal breath sounds clear to auscultation       Cardiovascular negative cardio ROS Normal cardiovascular exam Rhythm:Regular Rate:Normal     Neuro/Psych negative neurological ROS  negative psych ROS   GI/Hepatic negative GI ROS, Neg liver ROS,   Endo/Other  negative endocrine ROS  Renal/GU negative Renal ROS  negative genitourinary   Musculoskeletal negative musculoskeletal ROS (+)   Abdominal   Peds negative pediatric ROS (+)  Hematology negative hematology ROS (+)   Anesthesia Other Findings   Reproductive/Obstetrics negative OB ROS                             Anesthesia Physical Anesthesia Plan  ASA: II  Anesthesia Plan: General   Post-op Pain Management: GA combined w/ Regional for post-op pain   Induction: Intravenous  Airway Management Planned: LMA  Additional Equipment:   Intra-op Plan:   Post-operative Plan: Extubation in OR  Informed Consent: I have reviewed the patients History and Physical, chart, labs and discussed the procedure including the risks, benefits and alternatives for the proposed anesthesia with the patient or authorized representative who has indicated his/her understanding and acceptance.   Dental advisory given  Plan Discussed with: CRNA  Anesthesia Plan Comments:         Anesthesia Quick Evaluation  

## 2015-06-24 NOTE — Op Note (Signed)
See note [id number]

## 2015-06-24 NOTE — Anesthesia Procedure Notes (Addendum)
Procedure Name: MAC Date/Time: 06/24/2015 11:59 AM Performed by: Attu Station Desanctis Pre-anesthesia Checklist: Patient identified, Timeout performed, Emergency Drugs available, Suction available and Patient being monitored Patient Re-evaluated:Patient Re-evaluated prior to inductionOxygen Delivery Method: Simple face mask   Anesthesia Regional Block:  Axillary brachial plexus block  Pre-Anesthetic Checklist: ,, timeout performed, Correct Patient, Correct Site, Correct Laterality, Correct Procedure, Correct Position, site marked, Risks and benefits discussed, Surgical consent,  Pre-op evaluation,  Post-op pain management  Laterality: Left  Prep: chloraprep       Needles:  Injection technique: Single-shot  Needle Type: Stimulator Needle - 40     Needle Length: 4cm 4 cm Needle Gauge: 22 and 22 G    Additional Needles:  Procedures: ultrasound guided (picture in chart) Axillary brachial plexus block Narrative:  Injection made incrementally with aspirations every 5 mL.  Performed by: Personally  Anesthesiologist: Lewie Loron  Additional Notes: BP cuff, EKG monitors applied. Sedation begun. Nerve location verified with U/S. Anesthetic injected incrementally, slowly , and after neg aspirations under direct u/s guidance. Good perineural spread. Tolerated well.

## 2015-06-24 NOTE — Progress Notes (Signed)
Assisted Dr. Germeroth with left, ultrasound guided, axillary block. Side rails up, monitors on throughout procedure. See vital signs in flow sheet. Tolerated Procedure well. 
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
## 2015-06-25 ENCOUNTER — Encounter (HOSPITAL_BASED_OUTPATIENT_CLINIC_OR_DEPARTMENT_OTHER): Payer: Self-pay | Admitting: Orthopedic Surgery

## 2015-06-25 NOTE — Op Note (Signed)
NAMEROSE, HIPPLER NO.:  [id number]  MEDICAL RECORD NO.:  [id number]  LOCATION:                               FACILITY:  [facility]  PHYSICIAN:  Artist Pais. Adri Schloss, M.D.DATE OF BIRTH:  [birth year]  DATE OF PROCEDURE:  06/24/2015 DATE OF DISCHARGE:  06/24/2015                              OPERATIVE REPORT   LOCATION:  [facility].  PREOPERATIVE DIAGNOSIS:  Displaced left lung and left ring metacarpal fractures.  POSTOPERATIVE DIAGNOSIS:  Displaced left lung and left ring metacarpal fractures.  PROCEDURE:  Open reduction and internal fixation above using 1.5 mm modular handset stainless steel plates and screws.  SURGEON:  Artist Pais. Mina Marble, M.D.  ASSISTANT:  Jonni Sanger, P.A.  ANESTHESIA:  Supraclavicular block and MAC.  COMPLICATION:  None.  DRAINS:  None.  DESCRIPTION OF PROCEDURE:  The patient was taken the operating suite. After induction of adequate supraclavicular block analgesia and monitored  anesthesia sedation, the left upper extremity was prepped and draped in sterile fashion.  An Esmarch was used to exsanguinate the limb.  Tourniquet was inflated to 275 mmHg.  At this point in time, we incised over the interval between the long and ring metacarpals dorsally and left hand skin was incised.  Dissection was carried down to the extensor digitorum communis tendons.  We carefully retracted these to the ulnar side and exposed the long metacarpal fracture.  Long metacarpal fracture was a short spiral oblique.  We reduced  the fracture and held with a reduction clamp, placed one 1.5 mm lag screw from radial to ulnar.  We then placed an 8-hole 1.5 mm stainless steel neutralization plate along the ulnar side of the metacarpal using 3 screws above and 3 screws below the fracture site.  Intraoperative fluoroscopy revealed adequate reduction in AP, lateral, and oblique view.  The plates were covered with 2-0 Vicryl,  closing the periosteum muscle over them.  We then retracted the tendons to expose the ring fracture, which was reduced and then fixed with a T-plate 1.5 mm 6-hole with no lag screws.  Intraoperative fluoroscopy revealed adequate reduction in AP, lateral, and oblique view.  This wound was also closed with 2-0 Vicryl to cover the plate, 4-0 Vicryl subcutaneously, and 3-0 Prolene subcuticular stitch on the skin.  Steri- Strips, 4x4s, fluffs, and a volar splint was applied.  The patient tolerated the procedure well and went to recovery room in stable fashion.     Artist Pais Mina Marble, M.D.     MAW/MEDQ  D:  06/24/2015  T:  06/25/2015  Job:  [id number]

## 2015-07-02 NOTE — Anesthesia Postprocedure Evaluation (Signed)
Anesthesia Post Note  Patient: Brian Hayden  Procedure(s) Performed: Procedure(s) (LRB): OPEN REDUCTION INTERNAL FIXATION (ORIF)LEFT LONG AND LEFT RING FINGER METACARPALS (Left)  Anesthesia type: MAC + ax block  Patient location: PACU  Post pain: Pain level controlled  Post assessment: Post-op Vital signs reviewed  Last Vitals: BP 113/94 mmHg  Pulse 69  Temp(Src) 36.6 C (Oral)  Resp 14  Ht  (1.803 m)  Wt 222 lb 8 oz (100.925 kg)  BMI 31.05 kg/m2  SpO2 98%  Post vital signs: Reviewed  Level of consciousness: awake  Complications: No apparent anesthesia complications

## 2015-07-02 NOTE — Addendum Note (Signed)
Addendum  created 07/02/15 0931 by Lewie Loron, MD   Modules edited: Notes Section   Notes Section:  Delete: [id number]; File: [id number]

## 2016-03-30 ENCOUNTER — Ambulatory Visit (INDEPENDENT_AMBULATORY_CARE_PROVIDER_SITE_OTHER): Payer: Commercial Managed Care - HMO | Admitting: Physician Assistant

## 2016-03-30 VITALS — BP 126/82 | HR 83 | Temp 97.8°F | Resp 18 | Ht 71.0 in | Wt 233.0 lb

## 2016-03-30 DIAGNOSIS — H00016 Hordeolum externum left eye, unspecified eyelid: Secondary | ICD-10-CM

## 2016-03-30 MED ORDER — DOXYCYCLINE HYCLATE 100 MG PO CAPS: 100.0000 mg | ORAL_CAPSULE | Freq: Two times a day (BID) | ORAL | Status: AC

## 2016-03-30 NOTE — Progress Notes (Signed)
   03/30/2016 5:15 PM   DOB: 01/27/1976 / MRN: [id number]  SUBJECTIVE:  Brian Hayden is a 40 y.o. male presenting for   He is allergic to pollen extract.   He  has a past medical history of blood clots (post knee sx 5-6 years ago); Blood dyscrasia; and Allergy.    He  reports that he has never smoked. He does not have any smokeless tobacco history on file. He reports that he does not drink alcohol or use illicit drugs. He  has no sexual activity history on file. The patient  has past surgical history that includes Knee surgery (Left, patella tendon repair) and Open reduction internal fixation (orif) metacarpal (Left, 06/24/2015).  His family history includes Breast cancer in his other; Diabetes in his father; Hypertension in his father and mother.  ROS  Problem list and medications reviewed and updated by myself where necessary, and exist elsewhere in the encounter.   OBJECTIVE:  BP 126/82 mmHg  Pulse 83  Temp(Src) 97.8 F (36.6 C) (Oral)  Resp 18  Ht 5\' 11"  (1.803 m)  Wt 233 lb (105.688 kg)  BMI 32.51 kg/m2  SpO2 99%  Physical Exam  No results found for this or any previous visit (from the past 72 hour(s)).  No results found.  ASSESSMENT AND PLAN  There are no diagnoses linked to this encounter.  The patient was advised to call or return to clinic if he does not see an improvement in symptoms or to seek the care of the closest emergency department if he worsens with the above plan.   Deliah BostonMichael Clark, MHS, PA-C [facility] 03/30/2016 5:15 PM

## 2016-03-30 NOTE — Progress Notes (Signed)
   03/30/2016 5:26 PM   DOB: 09/24/1976 / MRN: [id number]  SUBJECTIVE:  Brian Hayden is a 40 y.o. male presenting for left eyelid swelling that started 2 days ago.  Reports it became painful last night.  Has never had this before.  Has not tried anything.  Denies changes in vision, photophobia, fever, chills.    He is allergic to pollen extract.   He  has a past medical history of blood clots (post knee sx 5-6 years ago); Blood dyscrasia; and Allergy.    He  reports that he has never smoked. He does not have any smokeless tobacco history on file. He reports that he does not drink alcohol or use illicit drugs. He  has no sexual activity history on file. The patient  has past surgical history that includes Knee surgery (Left, patella tendon repair) and Open reduction internal fixation (orif) metacarpal (Left, 06/24/2015).  His family history includes Breast cancer in his other; Diabetes in his father; Hypertension in his father and mother.  Review of Systems  Constitutional: Negative for weight loss.  Skin: Negative for rash.  Neurological: Negative for dizziness and headaches.    Problem list and medications reviewed and updated by myself where necessary, and exist elsewhere in the encounter.   OBJECTIVE:  BP 126/82 mmHg  Pulse 83  Temp(Src) 97.8 F (36.6 C) (Oral)  Resp 18  Ht 5\' 11"  (1.803 m)  Wt 233 lb (105.688 kg)  BMI 32.51 kg/m2  SpO2 99%  Physical Exam  Constitutional: He is oriented to person, place, and time. He appears well-developed. He does not appear ill.  Eyes: Conjunctivae and EOM are normal. Pupils are equal, round, and reactive to light.    Cardiovascular: Normal rate.   Pulmonary/Chest: Effort normal.  Abdominal: He exhibits no distension.  Musculoskeletal: Normal range of motion.  Neurological: He is alert and oriented to person, place, and time. No cranial nerve deficit. Coordination normal.  Skin: Skin is warm and dry. He is not diaphoretic.  Psychiatric:  He has a normal mood and affect.  Nursing note and vitals reviewed.   No results found for this or any previous visit (from the past 72 hour(s)).  No results found.  ASSESSMENT AND PLAN  Brian Hayden was seen today for stye.  Diagnoses and all orders for this visit:  Hordeolum external, left: Advised copious warm compress.  If not better in 3-4 days then doxy.  If at anytime worse then call so we can refer to Dr. Dione BoozeGroat stat.  -     doxycycline (VIBRAMYCIN) 100 MG capsule; Take 1 capsule (100 mg total) by mouth 2 (two) times daily.    The patient was advised to call or return to clinic if he does not see an improvement in symptoms or to seek the care of the closest emergency department if he worsens with the above plan.   Deliah BostonMichael Clark, MHS, PA-C [facility] 03/30/2016 5:26 PM

## 2016-03-30 NOTE — Patient Instructions (Addendum)
Stye A stye is a bump on your eyelid caused by a bacterial infection. A stye can form inside the eyelid (internal stye) or outside the eyelid (external stye). An internal stye may be caused by an infected oil-producing gland inside your eyelid. An external stye may be caused by an infection at the base of your eyelash (hair follicle). Styes are very common. Anyone can get them at any age. They usually occur in just one eye, but you may have more than one in either eye.  CAUSES  The infection is almost always caused by bacteria called Staphylococcus aureus. This is a common type of bacteria that lives on your skin. RISK FACTORS You may be at higher risk for a stye if you have had one before. You may also be at higher risk if you have:  Diabetes.  Long-term illness.  Long-term eye redness.  A skin condition called seborrhea.  High fat levels in your blood (lipids). SIGNS AND SYMPTOMS  Eyelid pain is the most common symptom of a stye. Internal styes are more painful than external styes. Other signs and symptoms may include:  Painful swelling of your eyelid.  A scratchy feeling in your eye.  Tearing and redness of your eye.  Pus draining from the stye. DIAGNOSIS  Your health care provider may be able to diagnose a stye just by examining your eye. The health care provider may also check to make sure:  You do not have a fever or other signs of a more serious infection.  The infection has not spread to other parts of your eye or areas around your eye. TREATMENT  Most styes will clear up in a few days without treatment. In some cases, you may need to use antibiotic drops or ointment to prevent infection. Your health care provider may have to drain the stye surgically if your stye is:  Large.  Causing a lot of pain.  Interfering with your vision. This can be done using a thin blade or a needle.  HOME CARE INSTRUCTIONS   Take medicines only as directed by your health care  provider.  Apply a clean, warm compress to your eye for 10 minutes, 4 times a day.  Do not wear contact lenses or eye makeup until your stye has healed.  Do not try to pop or drain the stye. SEEK MEDICAL CARE IF:  You have chills or a fever.  Your stye does not go away after several days.  Your stye affects your vision.  Your eyeball becomes swollen, red, or painful. MAKE SURE YOU:  Understand these instructions.  Will watch your condition.  Will get help right away if you are not doing well or get worse.   This information is not intended to replace advice given to you by your health care provider. Make sure you discuss any questions you have with your health care provider.   Document Released: 07/13/2005 Document Revised: 10/24/2014 Document Reviewed: 01/17/2014 Elsevier Interactive Patient Education 2016 [employer]     IF you received an x-ray today, you will receive an invoice from [facility]. Please contact [facility] at [phone] with questions or concerns regarding your invoice.   IF you received labwork today, you will receive an invoice from [facility]. Please contact [facility] at [phone] with questions or concerns regarding your invoice.   Our billing staff will not be able to assist you with questions regarding bills from these companies.  You will be contacted with the lab   results as soon as they are available. The fastest way to get your results is to activate your My Chart account. Instructions are located on the last page of this paperwork. If you have not heard from us regarding the results in 2 weeks, please contact this office.     

## 2016-10-05 ENCOUNTER — Ambulatory Visit (INDEPENDENT_AMBULATORY_CARE_PROVIDER_SITE_OTHER): Payer: Commercial Managed Care - HMO

## 2016-10-05 ENCOUNTER — Ambulatory Visit (INDEPENDENT_AMBULATORY_CARE_PROVIDER_SITE_OTHER): Payer: Commercial Managed Care - HMO | Admitting: Physician Assistant

## 2016-10-05 VITALS — BP 152/90 | HR 79 | Temp 98.5°F | Resp 16 | Ht 70.5 in | Wt 231.4 lb

## 2016-10-05 DIAGNOSIS — M25521 Pain in right elbow: Secondary | ICD-10-CM | POA: Diagnosis not present

## 2016-10-05 DIAGNOSIS — M25421 Effusion, right elbow: Secondary | ICD-10-CM

## 2016-10-05 NOTE — Patient Instructions (Signed)
     IF you received an x-ray today, you will receive an invoice from [facility]. Please contact [facility] at [phone] with questions or concerns regarding your invoice.   IF you received labwork today, you will receive an invoice from [facility]. Please contact [facility] at [phone] with questions or concerns regarding your invoice.   Our billing staff will not be able to assist you with questions regarding bills from these companies.  You will be contacted with the lab results as soon as they are available. The fastest way to get your results is to activate your My Chart account. Instructions are located on the last page of this paperwork. If you have not heard from us regarding the results in 2 weeks, please contact this office.     

## 2016-10-05 NOTE — Progress Notes (Signed)
   10/11/2016 9:00 AM   DOB: 01/30/1976 / MRN: [id number]  SUBJECTIVE:  Brian Hayden is a 40 y.o. male presenting for right elbow swelling that began after wrestling his son last night.  Complains of mild pain about the olecranon along with the swelling.  No weakness or ROM changes.  No fever, chills, nausea, or exquisite tenderness.   He is allergic to pollen extract.   He  has a past medical history of Allergy; Blood dyscrasia; and blood clots (post knee sx 5-6 years ago).    He  reports that he has never smoked. He does not have any smokeless tobacco history on file. He reports that he does not drink alcohol or use drugs. He  has no sexual activity history on file. The patient  has a past surgical history that includes Knee surgery (Left, patella tendon repair) and Open reduction internal fixation (orif) metacarpal (Left, 06/24/2015).  His family history includes Breast cancer in his other; Diabetes in his father; Hypertension in his father and mother.  Review of Systems  Constitutional: Negative for weight loss.  Gastrointestinal: Negative for nausea.  Neurological: Negative for dizziness.    The problem list and medications were reviewed and updated by myself where necessary and exist elsewhere in the encounter.   OBJECTIVE:  BP (!) 152/90 (BP Location: Right Arm, Patient Position: Sitting, Cuff Size: Large)   Pulse 79   Temp 98.5 F (36.9 C) (Oral)   Resp 16   Ht 5' 10.5" (1.791 m)   Wt 231 lb 6.4 oz (105 kg)   SpO2 99%   BMI 32.73 kg/m   Physical Exam  Constitutional: He appears well-developed. He does not appear ill.  Cardiovascular: Normal rate.   Musculoskeletal: Normal range of motion.       Arms: Skin: Skin is warm and dry. He is not diaphoretic.  Psychiatric: He has a normal mood and affect.  Nursing note and vitals reviewed.   No results found for this or any previous visit (from the past 72 hour(s)).  No results found.  ASSESSMENT AND PLAN  Brian Hayden was  seen today for elbow pain.  Diagnoses and all orders for this visit:  Pain and swelling of right elbow: Non infective olecranon bursitis.  Advised he avoid any pressure over the area and to take 400 mg ibuprofen as needed  Cotton dressing and ace supplied if he continues to "bump" the area.  RTC if pain and/or fever.  -     DG Elbow 2 Views Right; Future    The patient is advised to call or return to clinic if he does not see an improvement in symptoms, or to seek the care of the closest emergency department if he worsens with the above plan.   Deliah BostonMichael Macil Hayden, MHS, PA-C [facility] 10/11/2016 9:00 AM

## 2019-05-08 ENCOUNTER — Encounter (HOSPITAL_COMMUNITY): Payer: Self-pay

## 2019-05-08 ENCOUNTER — Ambulatory Visit (HOSPITAL_COMMUNITY)
Admission: EM | Admit: 2019-05-08 | Discharge: 2019-05-08 | Disposition: A | Discharge status: Home / Self Care | Payer: 59 | Attending: Urgent Care | Admitting: Urgent Care

## 2019-05-08 ENCOUNTER — Other Ambulatory Visit: Payer: Self-pay

## 2019-05-08 ENCOUNTER — Ambulatory Visit (INDEPENDENT_AMBULATORY_CARE_PROVIDER_SITE_OTHER): Payer: 59

## 2019-05-08 DIAGNOSIS — M25562 Pain in left knee: Secondary | ICD-10-CM

## 2019-05-08 DIAGNOSIS — Z9889 Other specified postprocedural states: Secondary | ICD-10-CM

## 2019-05-08 DIAGNOSIS — M25462 Effusion, left knee: Secondary | ICD-10-CM

## 2019-05-08 DIAGNOSIS — M7052 Other bursitis of knee, left knee: Secondary | ICD-10-CM | POA: Diagnosis not present

## 2019-05-08 MED ORDER — PREDNISONE 20 MG PO TABS: ORAL_TABLET | ORAL | 0 refills | Status: DC

## 2019-05-08 NOTE — ED Triage Notes (Signed)
Patient presents to Urgent Care with complaints of left knee pain since crawling on the floor playing with his children. Patient reports he has had surgery on the knee before and is worried something may be wrong. Copper knee brace in place upon arrival.

## 2019-05-08 NOTE — ED Provider Notes (Signed)
MRN: [id number] DOB: Mar 15, 1976  Subjective:   Brian Hayden is a 43 y.o. male presenting for 2-day history of progressively worsening sharp left knee pain.  Patient states that he was crawling around on the floor on Monday and his pain started thereafter.  He has a history of patellar tendon tear with surgical repair more than 10 years ago.  Patient is not currently taking any medications.   Allergies  Allergen Reactions  . Pollen Extract     Past Medical History:  Diagnosis Date  . Allergy   . Blood dyscrasia    sickle cell trait  . Hx of blood clots post knee sx 5-6 years ago     Past Surgical History:  Procedure Laterality Date  . KNEE SURGERY Left patella tendon repair   5-6 years  . OPEN REDUCTION INTERNAL FIXATION (ORIF) METACARPAL Left 06/24/2015   Procedure: OPEN REDUCTION INTERNAL FIXATION (ORIF)LEFT LONG AND LEFT RING FINGER METACARPALS;  Surgeon: Charlotte Crumb, MD;  Location: [facility];  Service: Orthopedics;  Laterality: Left;  ANESTHESIA:  GENERAL/BLOCK    ROS  Objective:   Vitals: BP 134/84 (BP Location: Right Arm)   Pulse 91   Temp 98.3 F (36.8 C) (Oral)   Resp 17   SpO2 99%   Physical Exam Constitutional:      Appearance: Normal appearance. He is well-developed and normal weight.  HENT:     Head: Normocephalic and atraumatic.     Right Ear: External ear normal.     Left Ear: External ear normal.     Nose: Nose normal.     Mouth/Throat:     Pharynx: Oropharynx is clear.  Eyes:     Extraocular Movements: Extraocular movements intact.     Pupils: Pupils are equal, round, and reactive to light.  Cardiovascular:     Rate and Rhythm: Normal rate.  Pulmonary:     Effort: Pulmonary effort is normal.  Musculoskeletal:     Left knee: He exhibits decreased range of motion, swelling and erythema (Mild erythema with associated significant warmth of his knee). He exhibits no ecchymosis, normal alignment and normal patellar mobility.  Tenderness found. Patellar tendon tenderness noted. No medial joint line and no lateral joint line tenderness noted.  Neurological:     Mental Status: He is alert and oriented to person, place, and time.  Psychiatric:        Mood and Affect: Mood normal.        Behavior: Behavior normal.    Dg Knee Complete 4 Views Left  Result Date: 05/08/2019 CLINICAL DATA:  Injury. EXAM: LEFT KNEE - COMPLETE 4+ VIEW COMPARISON:  07/15/2007. FINDINGS: Bony densities noted along the inferior aspect of the patella. These could represent fracture fragments, age undetermined, and or patellar tendinous ossifications. No other acute focal bony abnormality identified. No evidence of dislocation. Prepatellar soft tissue swelling. Small knee joint effusion. IMPRESSION: Bony densities noted along the inferior aspect of the patella. These could represent fracture fragments, age undetermined and/or patellar tendinous ossifications. No other focal bony abnormality identified. Prepatellar soft tissue swelling. Small knee joint effusion. Electronically Signed   By: Marcello Moores  Register   On: 05/08/2019 13:33    Assessment and Plan :   1. Acute pain of left knee   2. Pain and swelling of left knee   3. History of left knee surgery   4. Bursitis of left knee, unspecified bursa   5. 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  We will use a short steroid course for his current bursitis and knee effusion.  We will also use rice method.  Recommended patient contact an orthopedist to set up follow-up. Counseled patient on potential for adverse effects with medications prescribed/recommended today, ER and return-to-clinic precautions discussed, patient verbalized understanding.    Wallis BambergMani, Daegen Berrocal, PA-C 05/08/19 1351

## 2019-05-08 NOTE — Discharge Instructions (Addendum)
[facility] Phone: [phone]  [facility] Phone: [phone]  [facility] Phone: [phone]  [facility] Phone: [phone]

## 2019-08-05 ENCOUNTER — Other Ambulatory Visit: Payer: Self-pay

## 2019-08-05 DIAGNOSIS — Z20822 Contact with and (suspected) exposure to covid-19: Secondary | ICD-10-CM

## 2019-08-07 LAB — NOVEL CORONAVIRUS, NAA: SARS-CoV-2, NAA: NOT DETECTED

## 2020-05-06 ENCOUNTER — Ambulatory Visit (HOSPITAL_COMMUNITY)
Admission: EM | Admit: 2020-05-06 | Discharge: 2020-05-06 | Disposition: A | Discharge status: Home / Self Care | Payer: 59 | Attending: Family Medicine | Admitting: Family Medicine

## 2020-05-06 ENCOUNTER — Encounter (HOSPITAL_COMMUNITY): Payer: Self-pay

## 2020-05-06 ENCOUNTER — Ambulatory Visit (INDEPENDENT_AMBULATORY_CARE_PROVIDER_SITE_OTHER): Payer: 59

## 2020-05-06 DIAGNOSIS — M25521 Pain in right elbow: Secondary | ICD-10-CM

## 2020-05-06 DIAGNOSIS — S5001XA Contusion of right elbow, initial encounter: Secondary | ICD-10-CM

## 2020-05-06 NOTE — ED Triage Notes (Signed)
Pt states he backed into corner of wall and hit right elbow. Pt has 1+ swelling of right elbow. Pt denies numbness and tingling. Pt has full ROM of elbow.

## 2020-05-06 NOTE — ED Provider Notes (Signed)
[facility]   [id number] 05/06/20 Arrival Time: 1918  ASSESSMENT & PLAN:  1. Elbow pain, right   2. Contusion of right elbow, initial encounter     I have personally viewed the imaging studies ordered this visit. No fracture.   Ice; ibuprofen. Activities as tolerated.  Orders Placed This Encounter  Procedures  . DG Elbow Complete Right     Follow-up Information    [facility].   Specialty: Family Medicine Why: As needed.               Reviewed expectations re: course of current medical issues. Questions answered. Outlined signs and symptoms indicating need for more acute intervention. Patient verbalized understanding. After Visit Summary given.  SUBJECTIVE: History from: patient. Brian Hayden is a 44 y.o. male who reports hitting his R elbow against wall at home; today; continued pain; mild swelling. No extremity sensation changes or weakness. No elbow ROM loss reported.    Past Surgical History:  Procedure Laterality Date  . KNEE SURGERY Left patella tendon repair   5-6 years  . OPEN REDUCTION INTERNAL FIXATION (ORIF) METACARPAL Left 06/24/2015   Procedure: OPEN REDUCTION INTERNAL FIXATION (ORIF)LEFT LONG AND LEFT RING FINGER METACARPALS;  Surgeon: Dairl Ponder, MD;  Location: [facility];  Service: Orthopedics;  Laterality: Left;  ANESTHESIA:  GENERAL/BLOCK      OBJECTIVE:  Vitals:   05/06/20 1941 05/06/20 1944  BP: (!) 158/63   Pulse: 81   Resp: 16   Temp: 98.6 F (37 C)   TempSrc: Oral   SpO2: 99%   Weight:  108.4 kg  Height:  5\' 11"  (1.803 m)    General appearance: alert; no distress HEENT: [place]; AT Neck: supple with FROM Resp: unlabored respirations Extremities: . RUE: warm with well perfused appearance; poorly localized moderate tenderness over right posterior elbow; without gross deformities; swelling: minimal; bruising: none; elbow ROM: normal but with discomfort CV: brisk  extremity capillary refill of RUE; 2+ radial pulse of RUE. Skin: warm and dry; no visible rashes Neurologic: gait normal; normal sensation and strength of RUE Psychological: alert and cooperative; normal mood and affect  Imaging: DG Elbow Complete Right  Result Date: 05/06/2020 CLINICAL DATA:  Trauma 2 dorsal aspect right elbow, pain EXAM: RIGHT ELBOW - COMPLETE 3+ VIEW COMPARISON:  10/05/2016 FINDINGS: Frontal, bilateral oblique, lateral views of the right elbow are obtained. No fracture, subluxation, or dislocation. Joint spaces are well preserved. No joint effusion. Stable olecranon spur, with mild overlying soft tissue swelling. IMPRESSION: 1. No acute fracture. 2. Mild dorsal soft tissue swelling. Electronically Signed   By: 10/07/2016 M.D.   On: 05/06/2020 20:22      Allergies  Allergen Reactions  . Pollen Extract     Past Medical History:  Diagnosis Date  . Allergy   . Blood dyscrasia    sickle cell trait  . Hx of blood clots post knee sx 5-6 years ago   Social History   Socioeconomic History  . Marital status: Married    Spouse name: Not on file  . Number of children: Not on file  . Years of education: Not on file  . Highest education level: Not on file  Occupational History  . Not on file  Tobacco Use  . Smoking status: Never Smoker  . Smokeless tobacco: Never Used  Vaping Use  . Vaping Use: Never used  Substance and Sexual Activity  . Alcohol use: Yes    Comment:  rarely  . Drug use: No  . Sexual activity: Not on file  Other Topics Concern  . Not on file  Social History Narrative  . Not on file   Social Determinants of Health   Financial Resource Strain:   . Difficulty of Paying Living Expenses:   Food Insecurity:   . Worried About Programme researcher, broadcasting/film/video in the Last Year:   . Barista in the Last Year:   Transportation Needs:   . Freight forwarder (Medical):   Marland Kitchen Lack of Transportation (Non-Medical):   Physical Activity:   . Days of  Exercise per Week:   . Minutes of Exercise per Session:   Stress:   . Feeling of Stress :   Social Connections:   . Frequency of Communication with Friends and Family:   . Frequency of Social Gatherings with Friends and Family:   . Attends Religious Services:   . Active Member of Clubs or Organizations:   . Attends Banker Meetings:   Marland Kitchen Marital Status:    Family History  Problem Relation Age of Onset  . Hypertension Mother   . Diabetes Father   . Hypertension Father   . Breast cancer Other    Past Surgical History:  Procedure Laterality Date  . KNEE SURGERY Left patella tendon repair   5-6 years  . OPEN REDUCTION INTERNAL FIXATION (ORIF) METACARPAL Left 06/24/2015   Procedure: OPEN REDUCTION INTERNAL FIXATION (ORIF)LEFT LONG AND LEFT RING FINGER METACARPALS;  Surgeon: Dairl Ponder, MD;  Location: [facility];  Service: Orthopedics;  Laterality: Left;  ANESTHESIA:  Lucy Chris, MD 05/06/20 2026

## 2021-01-04 IMAGING — DX DG ELBOW COMPLETE 3+V*R*
4 series · 4 of 4 positions shown · non-contrast
Comparison: 10/05/2016

CLINICAL DATA: Trauma 2 dorsal aspect right elbow, pain

EXAM:
RIGHT ELBOW - COMPLETE 3+ VIEW

[elbow ap]
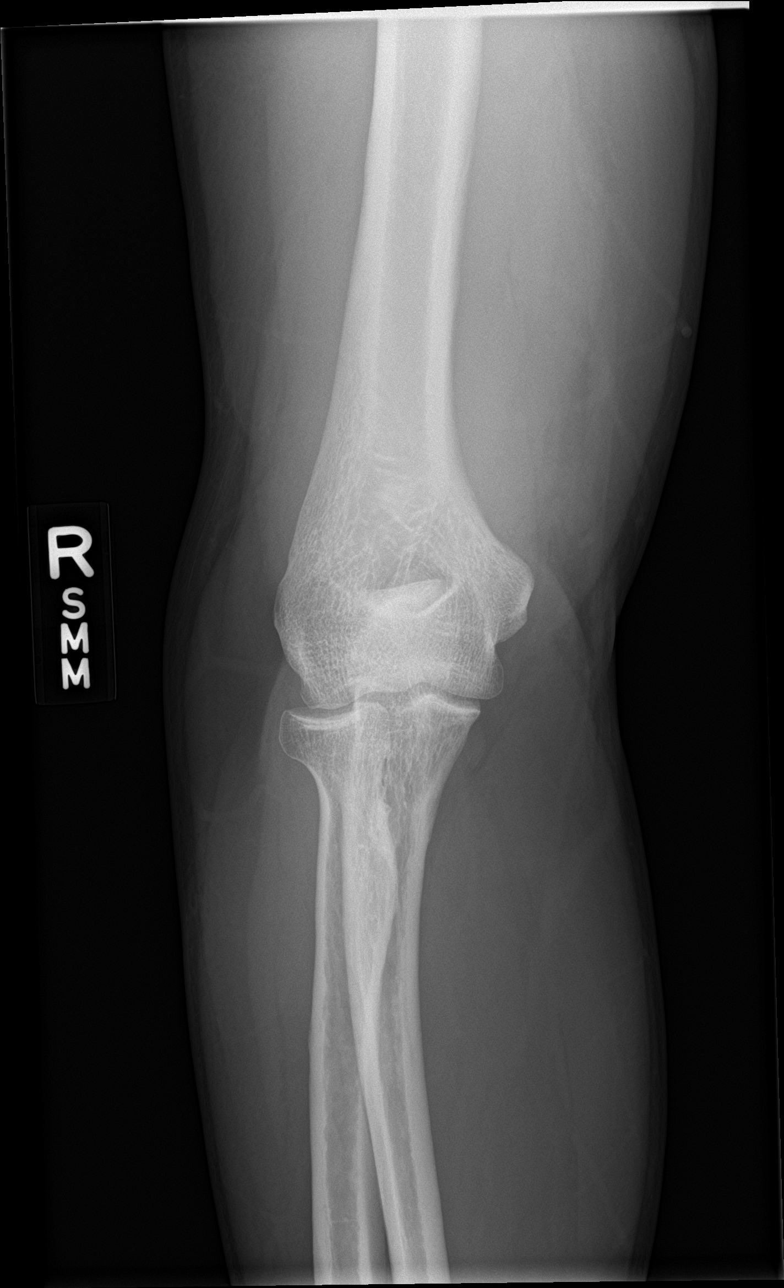

[elbow obl (1 of 2)]
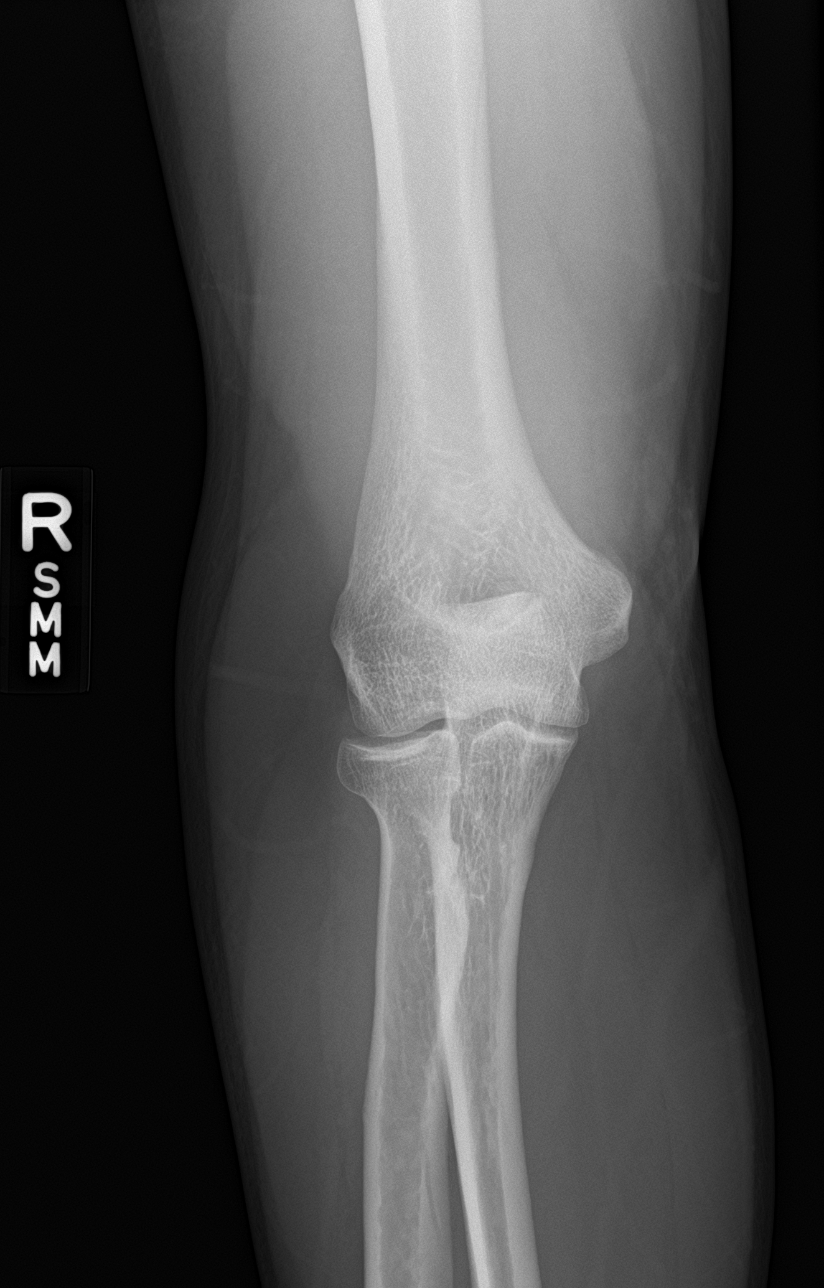

[elbow obl (2 of 2)]
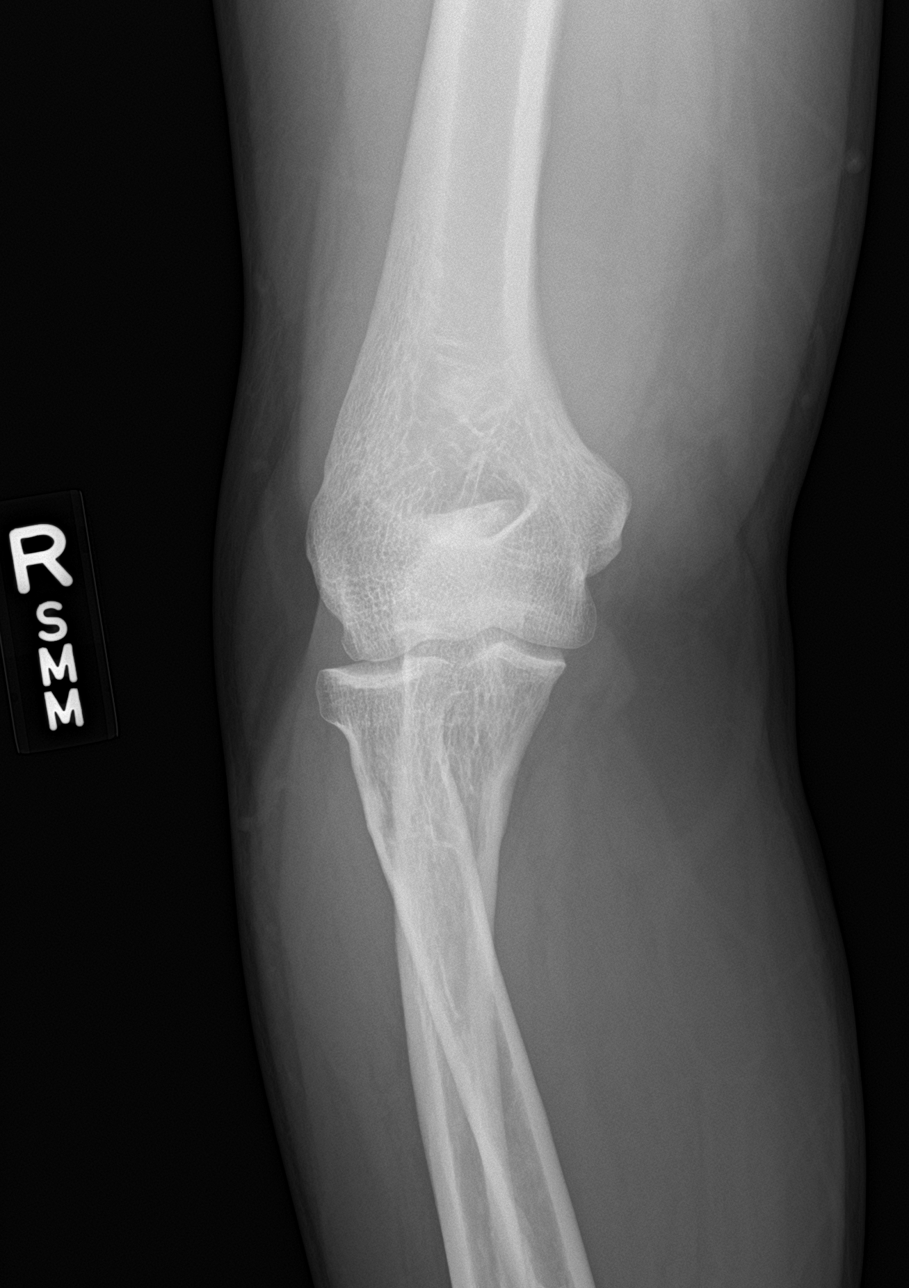

[elbow lat]
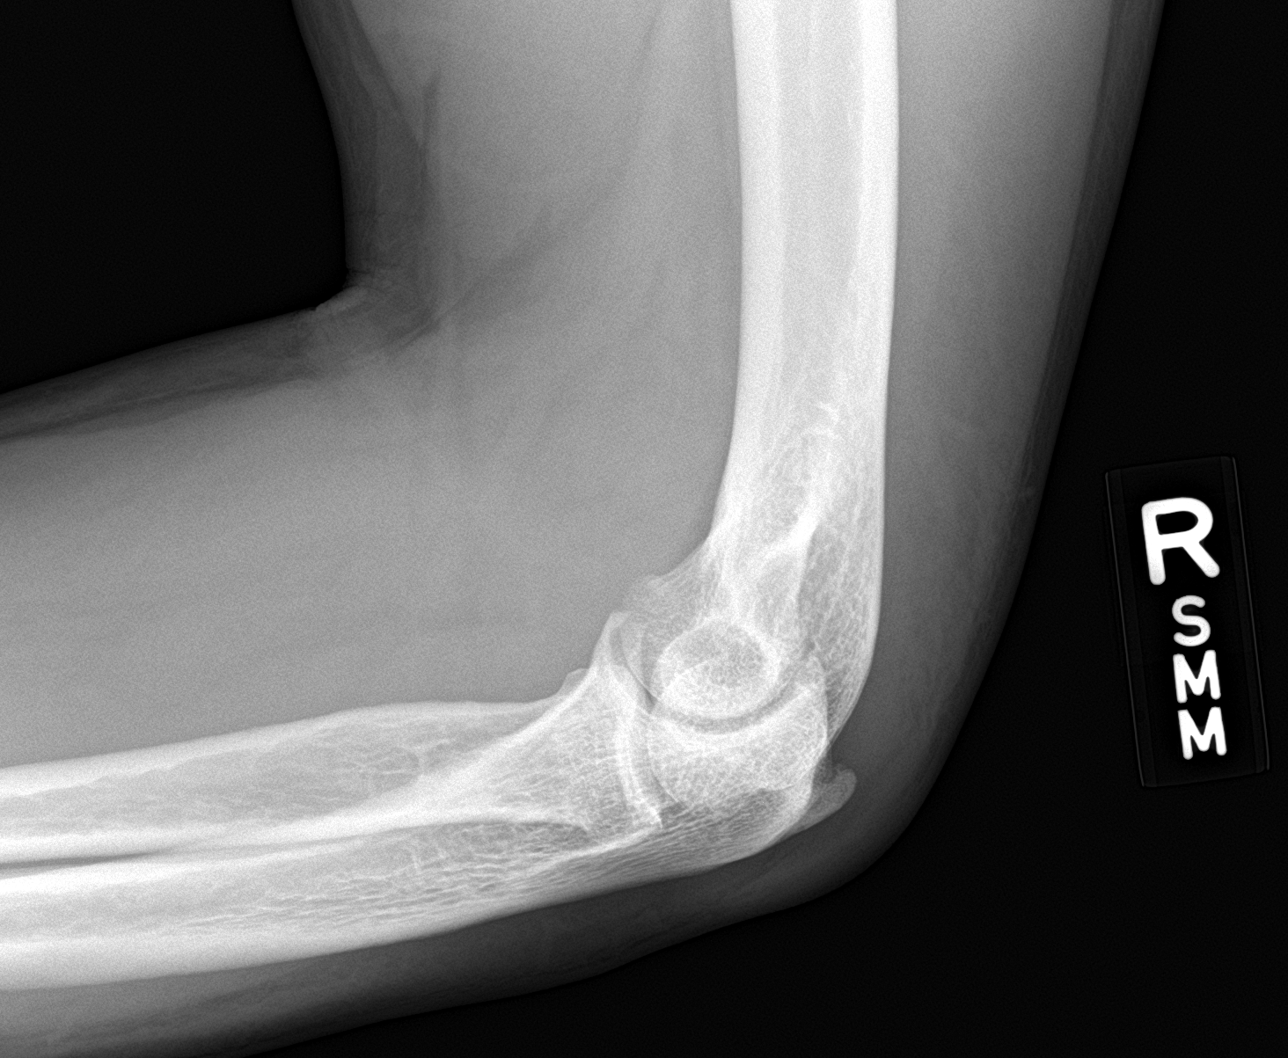

[4 of 4 positions shown; findings below may reference images not displayed]

FINDINGS: Frontal, bilateral oblique, lateral views of the right elbow are
obtained. No fracture, subluxation, or dislocation. Joint spaces are
well preserved. No joint effusion. Stable olecranon spur, with mild
overlying soft tissue swelling.
IMPRESSION: 1. No acute fracture.
2. Mild dorsal soft tissue swelling.

## 2022-06-04 ENCOUNTER — Ambulatory Visit (HOSPITAL_COMMUNITY)
Admission: RE | Admit: 2022-06-04 | Discharge: 2022-06-04 | Disposition: A | Discharge status: Home / Self Care | Payer: Managed Care, Other (non HMO) | Source: Ambulatory Visit | Attending: Internal Medicine | Admitting: Internal Medicine

## 2022-06-04 ENCOUNTER — Ambulatory Visit (INDEPENDENT_AMBULATORY_CARE_PROVIDER_SITE_OTHER): Payer: Managed Care, Other (non HMO)

## 2022-06-04 ENCOUNTER — Encounter (HOSPITAL_COMMUNITY): Payer: Self-pay

## 2022-06-04 VITALS — BP 148/82 | HR 78 | Temp 98.1°F | Resp 16

## 2022-06-04 DIAGNOSIS — M25561 Pain in right knee: Secondary | ICD-10-CM

## 2022-06-04 MED ORDER — ACETAMINOPHEN 500 MG PO TABS: 1000.0000 mg | ORAL_TABLET | Freq: Four times a day (QID) | ORAL | 0 refills | Status: AC | PRN

## 2022-06-04 MED ORDER — IBUPROFEN 800 MG PO TABS
800.0000 mg | ORAL_TABLET | Freq: Once | ORAL | Status: AC
  Administered 2022-06-04: 800 mg via ORAL

## 2022-06-04 MED ORDER — IBUPROFEN 600 MG PO TABS: 600.0000 mg | ORAL_TABLET | Freq: Four times a day (QID) | ORAL | 0 refills | Status: AC | PRN

## 2022-06-04 MED ORDER — IBUPROFEN 800 MG PO TABS
ORAL_TABLET | ORAL | Status: AC
  Filled 2022-06-04: qty 1

## 2022-06-04 NOTE — Discharge Instructions (Addendum)
Your x-rays of your right knee were negative for fracture or dislocation.  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  You likely sprained your right knee.   Wear the knee brace we provided in the clinic for the next couple of weeks to provide compression, stability, and comfort.  Please rest, ice, and elevate your right knee to help it heal and decrease inflammation.   Take 600mg  ibuprofen every 6 hours or tylenol 1,000 every 6 hours as needed for pain. If needed, you can alternate these medications so that you take one medication every 3 hours. For instance, at noon take ibuprofen, then at 3pm take tylenol, then at 6pm take ibuprofen.   Your next dose of ibuprofen may be tomorrow morning.  Your next dose of tylenol may be tonight if needed.  Call the orthopedic provider listed on your discharge paperwork to schedule a follow-up appointment if your symptoms do not improve in the next 1-2 weeks with supportive care.  Return to urgent care if you experience worsening pain, numbness, tingling, change of color in your skin near the injury, or any other concerning symptoms.  I hope you feel better!

## 2022-06-04 NOTE — ED Triage Notes (Signed)
Was running, stopped, right knee felt funny when stopping. Did not feel any popping or tearing sensation. Kept it elevated last night, hurts today. C/o medial knee pain, ROM intact, mildly antalgic gait, denies knee swelling.

## 2022-06-04 NOTE — ED Provider Notes (Signed)
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 - Entered by patient    HPI Brian Hayden is a 46 y.o. male.   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  Patient abruptly stopped running and states that his knee started to feel "weird and tight".  Denies feeling any popping sensation but does state that sometimes his knee feels like it locks up and he was not able to continue running after the injury last night.  He is able to walk with a steady gait without assistance and bear weight on his right leg without difficulty.  Currently rates pain to the right knee at a 5 on a scale of 0-10 and describes as a throbbing sensation.  No previous injury to the right knee reported.  Denies twisting injury.  Pain is worse to the medial superior patella.  He has not attempted use of any medications prior to arrival urgent care for his pain and states that he elevated his knee last night and placed ice on it for symptom relief.  No other aggravating or relieving factors identified for patient symptoms at this time.     Past Medical History:  Diagnosis Date   Allergy    Blood dyscrasia    sickle cell trait   Hx of blood clots post knee sx 5-6 years ago    There are no problems to display for this patient.   Past Surgical History:  Procedure Laterality Date   KNEE SURGERY Left patella tendon repair   5-6 years   OPEN REDUCTION INTERNAL FIXATION (ORIF) METACARPAL Left 06/24/2015   Procedure: OPEN REDUCTION INTERNAL FIXATION (ORIF)LEFT LONG AND LEFT RING FINGER METACARPALS;  Surgeon: Charlotte Crumb, MD;  Location: [facility];  Service: Orthopedics;   Laterality: Left;  ANESTHESIA:  GENERAL/BLOCK       Home Medications    Prior to Admission medications   Medication Sig Start Date End Date Taking? Authorizing Provider  acetaminophen (TYLENOL) 500 MG tablet Take 2 tablets (1,000 mg total) by mouth every 6 (six) hours as needed. 06/04/22  Yes Talbot Grumbling, FNP  ibuprofen (ADVIL) 600 MG tablet Take 1 tablet (600 mg total) by mouth every 6 (six) hours as needed. 06/04/22  Yes Talbot Grumbling, FNP  Multiple Vitamin (MULTIVITAMIN ADULT PO) Take by mouth.    [provider]    Family History Family History  Problem Relation Age of Onset   Hypertension Mother    Diabetes Father    Hypertension Father    Breast cancer Other     Social History Social History   Tobacco Use   Smoking status: Never   Smokeless tobacco: Never  Vaping Use   Vaping Use: Never used  Substance Use Topics   Alcohol use: Yes    Comment: rarely   Drug use: No     Allergies   Pollen extract   Review of Systems Review of Systems Per HPI  Physical Exam Triage Vital Signs ED Triage Vitals  Enc Vitals Group     BP 06/04/22 1656 (!) 148/82     Pulse  Rate 06/04/22 1656 78     Resp 06/04/22 1656 16     Temp 06/04/22 1656 98.1 F (36.7 C)     Temp Source 06/04/22 1656 Oral     SpO2 06/04/22 1656 98 %     Weight --      Height --      Head Circumference --      Peak Flow --      Pain Score 06/04/22 1658 5     Pain Loc --      Pain Edu? --      Excl. in GC? --    No data found.  Updated Vital Signs BP (!) 148/82 (BP Location: Left Arm)   Pulse 78   Temp 98.1 F (36.7 C) (Oral)   Resp 16   SpO2 98%   Visual Acuity Right Eye Distance:   Left Eye Distance:   Bilateral Distance:    Right Eye Near:   Left Eye Near:    Bilateral Near:     Physical Exam Vitals and nursing note reviewed.  Constitutional:      Appearance: Normal appearance. He is not ill-appearing or toxic-appearing.     Comments: Very pleasant  patient sitting on exam in position of comfort table in no acute distress.   HENT:     Head: Normocephalic and atraumatic.     Right Ear: Hearing and external ear normal.     Left Ear: Hearing and external ear normal.     Nose: Nose normal.     Mouth/Throat:     Lips: Pink.     Mouth: Mucous membranes are moist.  Eyes:     General: Lids are normal. Vision grossly intact. Gaze aligned appropriately.     Extraocular Movements: Extraocular movements intact.     Conjunctiva/sclera: Conjunctivae normal.  Pulmonary:     Effort: Pulmonary effort is normal.  Abdominal:     Palpations: Abdomen is soft.  Musculoskeletal:     Cervical back: Neck supple.     Right knee: No crepitus. Tenderness present.     Left knee: Normal.       Legs:     Comments: Right knee: Swelling to the superior medial patella present.  No crepitus to palpation or obvious bony abnormality/deformity visualized.  Range of motion is normal to the right knee.  5/5 strength against resistance with flexion and extension at the right knee.  +2 popliteal pulse present.  No ecchymosis or erythema.  Skin:    General: Skin is warm and dry.     Capillary Refill: Capillary refill takes less than 2 seconds.     Findings: No rash.  Neurological:     General: No focal deficit present.     Mental Status: He is alert and oriented to person, place, and time. Mental status is at baseline.     Cranial Nerves: No dysarthria or facial asymmetry.     Gait: Gait is intact.  Psychiatric:        Mood and Affect: Mood normal.        Speech: Speech normal.        Behavior: Behavior normal.        Thought Content: Thought content normal.        Judgment: Judgment normal.      UC Treatments / Results  Labs (all labs ordered are listed, but only abnormal results are displayed) Labs Reviewed - No data to display  EKG   Radiology No results found.  Procedures Procedures (including critical care time)  Medications Ordered in  UC Medications  ibuprofen (ADVIL) tablet 800 mg (800 mg Oral Given 06/04/22 1745)    Initial Impression / Assessment and Plan / UC Course  I have reviewed the triage vital signs and the nursing notes.  Pertinent labs & imaging results that were available during my care of the patient were reviewed by me and considered in my medical decision making (see chart for details).   1.  Acute pain of right knee X-ray of knee negative for fracture or dislocation.  Symptoms and physical exam consistent with acute sprain of right knee.  Patient provided knee brace in the clinic to provide stability and compression/comfort and has been advised to wear this for the next 1 to 2 weeks.  Advised to rest, ice, elevate, and compress the right knee to help heal and reduce inflammation.  Patient given ibuprofen 800 mg in the clinic and may take 600 mg of ibuprofen every 6 hours as needed with food to reduce inflammation and swelling to the knee.  Orthopedic walking referral given.  Patient ambulatory with steady gait and hemodynamically stable vital signs.  Patient agreeable with this plan.  Discussed physical exam and available lab work findings in clinic with patient.  Counseled patient regarding appropriate use of medications and potential side effects for all medications recommended or prescribed today. Discussed red flag signs and symptoms of worsening condition,when to call the PCP office, return to urgent care, and when to seek higher level of care in the emergency department. Patient verbalizes understanding and agreement with plan. All questions answered. Patient discharged in stable condition.  Final Clinical Impressions(s) / UC Diagnoses   Final diagnoses:  Acute pain of right knee     Discharge Instructions      Your x-rays of your right knee were negative for fracture or dislocation. You likely sprained your right knee.   Wear the knee brace we provided in the clinic for the next couple of weeks to  provide compression, stability, and comfort.  Please rest, ice, and elevate your right knee to help it heal and decrease inflammation.   Take 600mg  ibuprofen every 6 hours or tylenol 1,000 every 6 hours as needed for pain. If needed, you can alternate these medications so that you take one medication every 3 hours. For instance, at noon take ibuprofen, then at 3pm take tylenol, then at 6pm take ibuprofen.   Your next dose of ibuprofen may be tomorrow morning.  Your next dose of tylenol may be tonight if needed.  Call the orthopedic provider listed on your discharge paperwork to schedule a follow-up appointment if your symptoms do not improve in the next 1-2 weeks with supportive care.  Return to urgent care if you experience worsening pain, numbness, tingling, change of color in your skin near the injury, or any other concerning symptoms.  I hope you feel better!     ED Prescriptions     Medication Sig Dispense Auth. Provider   ibuprofen (ADVIL) 600 MG tablet Take 1 tablet (600 mg total) by mouth every 6 (six) hours as needed. 30 tablet M, FNP   acetaminophen (TYLENOL) 500 MG tablet Take 2 tablets (1,000 mg total) by mouth every 6 (six) hours as needed. 30 tablet M, FNP      PDMP not reviewed this encounter.   Carlisle Beers, Carlisle Beers 06/06/22 1116
